# Patient Record
Sex: Female | Born: 1965 | Race: White | Hispanic: Yes | Marital: Married | Smoking: Never smoker
Health system: Southern US, Community
[De-identification: ages and names within clinical notes are randomized; demographics above are authoritative.]

## PROBLEM LIST (undated history)

## (undated) DIAGNOSIS — Z8632 Personal history of gestational diabetes: Secondary | ICD-10-CM

## (undated) DIAGNOSIS — K219 Gastro-esophageal reflux disease without esophagitis: Secondary | ICD-10-CM

## (undated) DIAGNOSIS — Z973 Presence of spectacles and contact lenses: Secondary | ICD-10-CM

## (undated) HISTORY — DX: Gastro-esophageal reflux disease without esophagitis: K21.9

---

## 2004-11-15 ENCOUNTER — Inpatient Hospital Stay (HOSPITAL_COMMUNITY): Admission: AD | Admit: 2004-11-15 | Discharge: 2004-11-15 | Payer: Self-pay | Admitting: Obstetrics and Gynecology

## 2004-11-28 ENCOUNTER — Inpatient Hospital Stay (HOSPITAL_COMMUNITY): Admission: AD | Admit: 2004-11-28 | Discharge: 2004-11-28 | Payer: Self-pay | Admitting: Obstetrics and Gynecology

## 2004-12-26 ENCOUNTER — Other Ambulatory Visit: Admission: RE | Admit: 2004-12-26 | Discharge: 2004-12-26 | Payer: Self-pay | Admitting: Gynecology

## 2005-03-04 ENCOUNTER — Encounter: Admission: RE | Admit: 2005-03-04 | Discharge: 2005-03-04 | Payer: Self-pay | Admitting: Gynecology

## 2005-05-01 ENCOUNTER — Inpatient Hospital Stay (HOSPITAL_COMMUNITY): Admission: AD | Admit: 2005-05-01 | Discharge: 2005-05-02 | Payer: Self-pay | Admitting: Obstetrics & Gynecology

## 2005-05-02 ENCOUNTER — Encounter (INDEPENDENT_AMBULATORY_CARE_PROVIDER_SITE_OTHER): Payer: Self-pay | Admitting: Specialist

## 2005-05-02 ENCOUNTER — Inpatient Hospital Stay (HOSPITAL_COMMUNITY): Admission: AD | Admit: 2005-05-02 | Discharge: 2005-05-04 | Payer: Self-pay | Admitting: Gynecology

## 2005-05-02 HISTORY — PX: OTHER SURGICAL HISTORY: SHX169

## 2005-06-13 ENCOUNTER — Other Ambulatory Visit: Admission: RE | Admit: 2005-06-13 | Discharge: 2005-06-13 | Payer: Self-pay | Admitting: Gynecology

## 2006-07-23 ENCOUNTER — Other Ambulatory Visit: Admission: RE | Admit: 2006-07-23 | Discharge: 2006-07-23 | Payer: Self-pay | Admitting: Gynecology

## 2007-01-13 ENCOUNTER — Inpatient Hospital Stay (HOSPITAL_COMMUNITY): Admission: AD | Admit: 2007-01-13 | Discharge: 2007-01-13 | Payer: Self-pay | Admitting: Obstetrics and Gynecology

## 2007-05-09 ENCOUNTER — Inpatient Hospital Stay (HOSPITAL_COMMUNITY): Admission: AD | Admit: 2007-05-09 | Discharge: 2007-05-11 | Payer: Self-pay | Admitting: Internal Medicine

## 2007-05-10 ENCOUNTER — Encounter (INDEPENDENT_AMBULATORY_CARE_PROVIDER_SITE_OTHER): Payer: Self-pay | Admitting: Obstetrics and Gynecology

## 2007-05-10 HISTORY — PX: TUBAL LIGATION: SHX77

## 2008-12-20 ENCOUNTER — Ambulatory Visit: Payer: Self-pay | Admitting: Gynecology

## 2008-12-20 ENCOUNTER — Other Ambulatory Visit: Admission: RE | Admit: 2008-12-20 | Discharge: 2008-12-20 | Payer: Self-pay | Admitting: Gynecology

## 2009-02-15 ENCOUNTER — Encounter: Admission: RE | Admit: 2009-02-15 | Discharge: 2009-02-15 | Payer: Self-pay | Admitting: Gynecology

## 2009-02-21 ENCOUNTER — Encounter: Admission: RE | Admit: 2009-02-21 | Discharge: 2009-02-21 | Payer: Self-pay | Admitting: Gynecology

## 2010-01-29 ENCOUNTER — Ambulatory Visit
Admission: RE | Admit: 2010-01-29 | Discharge: 2010-01-29 | Payer: Self-pay | Source: Home / Self Care | Attending: Gynecology | Admitting: Gynecology

## 2010-01-29 ENCOUNTER — Other Ambulatory Visit
Admission: RE | Admit: 2010-01-29 | Discharge: 2010-01-29 | Payer: Self-pay | Source: Home / Self Care | Admitting: Gynecology

## 2010-02-10 ENCOUNTER — Encounter: Payer: Self-pay | Admitting: Gynecology

## 2010-04-03 ENCOUNTER — Other Ambulatory Visit: Payer: Self-pay | Admitting: Gynecology

## 2010-04-03 DIAGNOSIS — Z1231 Encounter for screening mammogram for malignant neoplasm of breast: Secondary | ICD-10-CM

## 2010-04-24 ENCOUNTER — Ambulatory Visit
Admission: RE | Admit: 2010-04-24 | Discharge: 2010-04-24 | Disposition: A | Discharge status: Home / Self Care | Payer: PRIVATE HEALTH INSURANCE | Source: Ambulatory Visit | Attending: Gynecology | Admitting: Gynecology

## 2010-04-24 DIAGNOSIS — Z1231 Encounter for screening mammogram for malignant neoplasm of breast: Secondary | ICD-10-CM

## 2010-06-04 NOTE — Op Note (Signed)
NAMEEULIA, HATCHER                 ACCOUNT NO.:  [id number]   MEDICAL RECORD NO.:  [id number]          PATIENT TYPE:  INP   LOCATION:  9125                          FACILITY:  [facility]   PHYSICIAN:  Malachi Pro. Ambrose Mantle, M.D. DATE OF BIRTH:  1965-11-18   DATE OF PROCEDURE:  05/10/2007  DATE OF DISCHARGE:                               OPERATIVE REPORT   PREOPERATIVE DIAGNOSIS:  Voluntary sterilization   POSTOPERATIVE DIAGNOSIS:  Voluntary sterilization   OPERATION:  Bilateral tubal ligation.   OPERATOR:  Malachi Pro. Ambrose Mantle, M.D.   ANESTHESIA:  General anesthesia.   DESCRIPTION OF PROCEDURE:  The patient was brought to the operating room  and given general anesthesia.  She was already in the supine position.  The abdomen was prepped with Betadine solution and draped as a sterile  field.  The inferior portion of the umbilicus was grasped with two Allis  clamps, and an incision was made through the skin between the two Allis  clamps.  I then dissected down through the subcutaneous tissue and  actually dissected down to the peritoneal lining without making any  incision.  I then entered the peritoneal cavity, identified both tubes,  traced them to their fimbriated ends.  The superior surface of the right  ovary was seen.  I did not see the left ovary.  I made a window in the  mesosalpinx bilaterally with the Bovie.  I placed two ties of #0 plain  catgut proximally and distally on each tube, and cut out the segment of  tube in between the ties.  There was no bleeding.  All the sutures were  cut, and because of the very thin abdominal wall, I tried to make an  effort to include all layers in my closure with two figure-of-eight  sutures, and an additional interrupted suture of #0 Vicryl.  The skin  was closed with interrupted 3-0 plain catgut.   The patient seemed to tolerate the procedure well, and was returned to  the recovery room in satisfactory condition.   BLOOD LOSS:  Less than 5  mL.      Malachi Pro. Ambrose Mantle, M.D.  Electronically Signed     TFH/MEDQ  D:  05/10/2007  T:  05/10/2007  Job:  [id number]

## 2010-06-04 NOTE — H&P (Signed)
NAMELILLYEN, SCHOW                 ACCOUNT NO.:  [id number]   MEDICAL RECORD NO.:  [id number]          PATIENT TYPE:  INP   LOCATION:  9125                          FACILITY:  [facility]   PHYSICIAN:  Malachi Pro. Ambrose Mantle, M.D. DATE OF BIRTH:  [birth year]   DATE OF ADMISSION:  05/09/2007  DATE OF DISCHARGE:                              HISTORY & PHYSICAL   This is a 45 year old Hispanic single female, para 3-1-1-4, gravida 65,  EDC May 19, 2007, who was admitted straight to labor and delivery  without stopping in the [place].  I was called and came immediately to the  hospital but the patient had already delivered when I arrived.  She was  delivered by a family practice resident.  The delivery was at 251, the  female was 5 pounds 14 ounces without Apgars of 9 at 1 and 9 at 5 minutes.  After I arrived, I did remove placental fragments mainly membranes from  the uterus.  There were no significant lacerations.  Blood group and  type was B+ with a negative antibody, nonreactive serology, rubella  immune, hepatitis B surface antigen negative, HIV negative, GC and  chlamydia negative.  One-hour Glucola 100.  Group B strep negative.  First trimester screen is negative, AFP negative.  The patient received  progesterone weekly for history of preterm birth.  She had E-coli in her  urine that was treated.  Prenatal care was otherwise uncomplicated.   PAST MEDICAL HISTORY:  No known allergies.  No operations.  No  illnesses.   SOCIAL HISTORY:  Alcohol, tobacco and drugs none.   FAMILY HISTORY:  Negative.   OBSTETRIC HISTORY:  Revealed four previous vaginal deliveries, one was  premature 35-36 weeks weighing 4 pounds 5 ounces.  The patient also had  a spontaneous abortion.   PHYSICAL EXAM ON ADMISSION:  VITAL SIGNS:  Normal.  HEART/LUNGS:  Normal.  ABDOMEN:  Soft.  When I saw the patient, she had already delivered the  fundus, was below the umbilicus, membranes were removed from the uterus  on multiple  entries with the ring forceps and my fingers no lacerations.  The patient does request bilateral tubal ligation.  This has been  discussed with her through an interpreter with her and her significant  other and they request to proceed with tubal ligation.   IMPRESSION:  Intrauterine pregnancy at 38+ weeks delivered, voluntary  sterilization.  The patient is prepared for tubal ligation.      Malachi Pro. Ambrose Mantle, M.D.  Electronically Signed     TFH/MEDQ  D:  05/10/2007  T:  05/10/2007  Job:  [id number]

## 2010-06-04 NOTE — Discharge Summary (Signed)
Suzanne Harrington, Harrington                 ACCOUNT NO.:  [id number]   MEDICAL RECORD NO.:  [id number]          PATIENT TYPE:  INP   LOCATION:  9125                          [facility]ACILITY:  [facility]   PHYSICIAN:  Malachi Pro. Ambrose Mantle, M.D. DATE OF BIRTH:  1965-10-14   DATE OF ADMISSION:  05/09/2007  DATE OF DISCHARGE:  05/11/2007                               DISCHARGE SUMMARY   This is a 45 year old Hispanic single female para 3-1-1-4, gravida 81,  EDC May 19, 2007, admitted without stopping in the maternity admission  unit because she was in active labor.  I was called and came immediately  to the hospital but the baby had delivered by Dr. Ludwig Clarks, a family  practice resident at 2:51 p.m.  The baby was female and had Apgars of 9 at  1 and 9 at 5 minutes.  The baby's weight was 5 pounds 14 ounces.  After  I arrived, I checked the inside of the uterus.  There were copious  membranes left in the uterus which I removed with my fingers and with  the ring forceps.  There were no significant lacerations.  The patient's  blood group and type was B+ with a negative antibody, nonreactive  serology, rubella immune, hepatitis B surface antigen negative, HIV  negative, GC and Chlamydia negative.  One-hour Glucola 100.  Group B  strep negative.  First trimester screen negative, AFP was negative.  The  patient had received progesterone weekly for history of preterm birth.  She had an E-coli UTI that was treated in early pregnancy.  Prenatal  care was otherwise uncomplicated.   PAST MEDICAL HISTORY:  No known allergies.  No operations.  No  illnesses.   SOCIAL HISTORY:  Alcohol, tobacco and drugs none.   FAMILY HISTORY:  Negative.   OBSTETRIC HISTORY:  Reported in the history and physical.   POSTPARTUM PHYSICAL EXAM:  VITAL SIGNS:  Normal.  HEART/LUNGS:  Normal.  ABDOMEN:  Soft, fundus was below the umbilicus.   The patient requested tubal ligation through an interpreter.  We  confirmed that she was very serious  about the tubal ligation  and  understood the risks and benefits.  She underwent a tubal ligation by  Dr. Ambrose Mantle under general anesthesia on May 10, 2007.  Blood loss was  less than 5 mL but after the patient was returned to recovery room,  there was some bleeding from the left side of the incision that did not  stop with reinforcing the dressings.  So, Dr. Jackelyn Knife came in and  instilled the incision with 0.5% Marcaine and use three sutures of 3-0  Vicryl to control the bleeding.  The patient has had no further problems  on the second postpartum and first postop day, is ready for discharge.   LABORATORY DATA:  Initial hemoglobin  12.6, hematocrit 36.5, white count  7,900, platelet count 130,000.  Follow-up hemoglobin 11.3, platelet  count 118,000.  RPR was nonreactive.   FINAL DIAGNOSIS:  Intrauterine pregnancy at 38+ weeks delivered,  voluntary sterilization.   OPERATION:  Bilateral tubal ligation, suture of umbilical and skin  incision to control bleeding.   FINAL CONDITION:  Improved.   INSTRUCTIONS:  Regular discharge instructions.  The patient is given  prescriptions for Motrin 600 mg #30 tablets one every 6 hours as needed  for pain and Percocet 5/325 #30 tablets one every 4-6 hours as needed  for pain.  She is advised to return to the office in 2 weeks for follow-  up examination.      Malachi Pro. Ambrose Mantle, M.D.  Electronically Signed     TFH/MEDQ  D:  05/11/2007  T:  05/11/2007  Job:  [id number]

## 2010-06-07 NOTE — Op Note (Signed)
[name], Suzanne Harrington                 ACCOUNT NO.:  [id number]   MEDICAL RECORD NO.:  [id number]          PATIENT TYPE:  INP   LOCATION:  9198                          FACILITY:  [facility]   PHYSICIAN:  Juan H. Lily Peer, M.D.DATE OF BIRTH:  08/19/1965   DATE OF PROCEDURE:  05/02/2005  DATE OF DISCHARGE:                                 OPERATIVE REPORT   PROCEDURE:  Manual extraction of placenta.   SURGEON:  Juan H. Lily Peer, M.D.   DESCRIPTION OF PROCEDURE:  Manual extraction of the placenta was undertaken  in a sterile fashion after the patient had received fentanyl, Versed and  propofol and nitroglycerin sublingual to help relax her uterus.  An IV line  with LR had been started and the patient had been typed and screened and a  CBC had been obtained.  After the vagina and perineum were prepped and  draped in the usual sterile fashion, a manual extraction of the placenta was  accomplished with complete evacuation of the entire uterus.  Pitocin 40  units in a liter of LR was administered intravenously; the uterus firmed and  total blood loss from the delivery and the postpartum period in the  operating room for approximately 300-400 mL.  The patient remained  normotensive during the entire time.   She will receive a gram of Cefotan IV for prophylaxis and will be in the  recovery room and then to the routine postpartum orders.      Juan H. Lily Peer, M.D.  Electronically Signed     JHF/MEDQ  D:  05/02/2005  T:  05/02/2005  Job:  [id number]

## 2010-06-07 NOTE — H&P (Signed)
NAMEJORDON, BOURQUIN                 ACCOUNT NO.:  [id number]   MEDICAL RECORD NO.:  [id number]          PATIENT TYPE:  INP   LOCATION:  9198                          [facility]ILITY[facility]  [facility]   PHYSICIAN:  Juan H. Lily Peer, M.D.DATE OF BIRTH:  04-28-1965   DATE OF ADMISSION:  05/02/2005  DATE OF DISCHARGE:                                HISTORY & PHYSICAL   HISTORY:  The patient is a 45 year old gravida 4, para 3 at 34-5/7 weeks'  gestation, that presented to [facility] last evening thinking that she  had rupture of membranes.  Nitrazine and fern were negative.  She was not in  labor.  Fetal heart rate tracing was reassuring and had an ultrasound with  an AFI of 10 cm.  The patient was discharged home with instructions to  follow up in the office in the morning and this morning the patient and her  husband stated that she began having contractions at 7:30 in the morning and  was rushed to [facility] and arrived at 9:11 a.m. this morning.   DELIVERY NOTE:  She was completely dilated and delivered precipitously by  the nursing team in Maternity Admissions upon my arrival.  She delivered a  viable female infant, Apgar scores of 8 and 9, with a weight of 4 pounds 9  ounces and went to the newborn nursery.  The umbilical cord had sheared off  in the placenta and the patient was rushed to the operating room for manual  extraction of the placenta.      Juan H. Lily Peer, M.D.  Electronically Signed     JHF/MEDQ  D:  05/02/2005  T:  05/02/2005  Job:  [id number]

## 2010-06-21 ENCOUNTER — Ambulatory Visit (INDEPENDENT_AMBULATORY_CARE_PROVIDER_SITE_OTHER): Payer: PRIVATE HEALTH INSURANCE | Admitting: Women's Health

## 2010-06-21 DIAGNOSIS — N926 Irregular menstruation, unspecified: Secondary | ICD-10-CM

## 2010-06-21 DIAGNOSIS — R51 Headache: Secondary | ICD-10-CM

## 2010-10-15 LAB — CBC
HCT: 33.2 — ABNORMAL LOW
HCT: 36.5
Hemoglobin: 11.3 — ABNORMAL LOW
Hemoglobin: 12.6
MCHC: 34.1
MCHC: 34.6
MCV: 84.2
MCV: 85.5
Platelets: 118 — ABNORMAL LOW
Platelets: 130 — ABNORMAL LOW
RBC: 3.88
RBC: 4.34
RDW: 15.8 — ABNORMAL HIGH
RDW: 16.1 — ABNORMAL HIGH
WBC: 12.8 — ABNORMAL HIGH
WBC: 7.9

## 2010-10-15 LAB — RPR: RPR Ser Ql: NONREACTIVE

## 2011-03-13 ENCOUNTER — Encounter: Payer: Self-pay | Admitting: Gynecology

## 2011-03-13 ENCOUNTER — Ambulatory Visit (INDEPENDENT_AMBULATORY_CARE_PROVIDER_SITE_OTHER): Payer: PRIVATE HEALTH INSURANCE | Admitting: Gynecology

## 2011-03-13 VITALS — BP 120/72 | Ht 62.5 in | Wt 136.0 lb

## 2011-03-13 DIAGNOSIS — N898 Other specified noninflammatory disorders of vagina: Secondary | ICD-10-CM

## 2011-03-13 DIAGNOSIS — Z01419 Encounter for gynecological examination (general) (routine) without abnormal findings: Secondary | ICD-10-CM

## 2011-03-13 DIAGNOSIS — L293 Anogenital pruritus, unspecified: Secondary | ICD-10-CM

## 2011-03-13 LAB — CBC WITH DIFFERENTIAL/PLATELET
Basophils Absolute: 0 10*3/uL (ref 0.0–0.1)
Basophils Relative: 1 % (ref 0–1)
Eosinophils Absolute: 0 10*3/uL (ref 0.0–0.7)
Eosinophils Relative: 1 % (ref 0–5)
HCT: 42.1 % (ref 36.0–46.0)
Hemoglobin: 13.9 g/dL (ref 12.0–15.0)
Lymphocytes Relative: 38 % (ref 12–46)
Lymphs Abs: 1.7 10*3/uL (ref 0.7–4.0)
MCH: 28.7 pg (ref 26.0–34.0)
MCHC: 33 g/dL (ref 30.0–36.0)
MCV: 87 fL (ref 78.0–100.0)
Monocytes Absolute: 0.5 10*3/uL (ref 0.1–1.0)
Monocytes Relative: 10 % (ref 3–12)
Neutro Abs: 2.3 10*3/uL (ref 1.7–7.7)
Platelets: 275 10*3/uL (ref 150–400)
RBC: 4.84 MIL/uL (ref 3.87–5.11)
RDW: 13.2 % (ref 11.5–15.5)
WBC: 4.5 10*3/uL (ref 4.0–10.5)

## 2011-03-13 LAB — WET PREP FOR TRICH, YEAST, CLUE: Trich, Wet Prep: NONE SEEN

## 2011-03-13 LAB — CHOLESTEROL, TOTAL: Cholesterol: 164 mg/dL (ref 0–200)

## 2011-03-13 MED ORDER — TERCONAZOLE 0.8 % VA CREA: 1.0000 | TOPICAL_CREAM | Freq: Every day | VAGINAL | Status: AC

## 2011-03-13 NOTE — Progress Notes (Signed)
Suzanne Harrington 11-29-1965 [id number]   History:    46 y.o.  for annual exam with a complaint of vulvar pruritus. Patient stated a month ago she went to an urgent care facility and was treated for a "bacterial infection" with similar complaints. Review of patient's records indicated that she had a normal Pap smear November 2012. She's had a previous tubal sterilization procedure. She's has normal menstrual cycles. Last mammogram April 2012 negative. Patient in frequently does her self breast examinations. Patient was weighing 140 pounds last years down to 136. (BMI less than 25).  Past medical history,surgical history, family history and social history were all reviewed and documented in the EPIC chart.  Gynecologic History Patient's last menstrual period was 02/19/2011. Contraception: tubal ligation Last Pap: 2012. Results were: normal Last mammogram: 2012. Results were: normal  Obstetric History OB History    Grav Para Term Preterm Abortions TAB SAB Ect Mult Living   6 5 5  1  1   5      # Outc Date GA Lbr Len/2nd Wgt Sex Del Anes PTL Lv   1 TRM     F SVD  No Yes   2 TRM     F SVD  No Yes   3 TRM     F SVD  No Yes   4 TRM     F SVD  No Yes   5 TRM     M SVD  No Yes   6 SAB                ROS:  Was performed and pertinent positives and negatives are included in the history.  Exam: chaperone present  BP 120/72  Ht 5' 2.5" (1.588 m)  Wt 136 lb (61.689 kg)  BMI 24.48 kg/m2  LMP 02/19/2011  Body mass index is 24.48 kg/(m^2).  General appearance : Well developed well nourished female. No acute distress HEENT: Neck supple, trachea midline, no carotid bruits, no thyroidmegaly Lungs: Clear to auscultation, no rhonchi or wheezes, or rib retractions  Heart: Regular rate and rhythm, no murmurs or gallops Breast:Examined in sitting and supine position were symmetrical in appearance, no palpable masses or tenderness,  no skin retraction, no nipple inversion, no nipple discharge, no skin  discoloration, no axillary or supraclavicular lymphadenopathy Abdomen: no palpable masses or tenderness, no rebound or guarding Extremities: no edema or skin discoloration or tenderness  Pelvic:  Bartholin, Urethra, Skene Glands: Within normal limits             Vagina: No gross lesions or discharge  Cervix: No gross lesions or discharge  Uterus  anteverted, normal size, shape and consistency, non-tender and mobile  Adnexa  Without masses or tenderness  Anus and perineum  normal   Rectovaginal  normal sphincter tone without palpated masses or tenderness             Hemoccult not done  Wet prep: Few bacteria noted    Assessment/Plan:  46 y.o. female for annual exam with vulvar pruritus. Wet prep demonstrated few bacteria. Were going to give her a trial of Terazol 7 to apply each bedtime for 7 days externally and of her symptoms persist she'll return back to the office for colposcopic evaluation. New Pap smear screening guidelines discussed so no Pap smear was done today since the last was done 2012 and was normal. CBC, cholesterol, and urinalysis was obtained today. She was reminded she needs her mammogram in April and to continue to do her  monthly self breast examination.    Ok Edwards MD, 10:59 AM 03/13/2011

## 2011-03-13 NOTE — Patient Instructions (Signed)
Recuerdate de hacer cita en Abril para el mammograma

## 2011-03-14 LAB — URINALYSIS W MICROSCOPIC + REFLEX CULTURE
Bacteria, UA: NONE SEEN
Bilirubin Urine: NEGATIVE
Casts: NONE SEEN
Crystals: NONE SEEN
Ketones, ur: NEGATIVE mg/dL
Leukocytes, UA: NEGATIVE
Nitrite: NEGATIVE
Protein, ur: NEGATIVE mg/dL
Specific Gravity, Urine: 1.023 (ref 1.005–1.030)
Squamous Epithelial / LPF: NONE SEEN
Urobilinogen, UA: 0.2 mg/dL (ref 0.0–1.0)
pH: 6 (ref 5.0–8.0)

## 2011-04-13 ENCOUNTER — Emergency Department (HOSPITAL_COMMUNITY)
Admission: EM | Admit: 2011-04-13 | Discharge: 2011-04-13 | Disposition: A | Discharge status: Home / Self Care | Payer: PRIVATE HEALTH INSURANCE | Attending: Emergency Medicine | Admitting: Emergency Medicine

## 2011-04-13 ENCOUNTER — Encounter (HOSPITAL_COMMUNITY): Payer: Self-pay | Admitting: *Deleted

## 2011-04-13 DIAGNOSIS — B9789 Other viral agents as the cause of diseases classified elsewhere: Secondary | ICD-10-CM | POA: Insufficient documentation

## 2011-04-13 DIAGNOSIS — R509 Fever, unspecified: Secondary | ICD-10-CM | POA: Insufficient documentation

## 2011-04-13 DIAGNOSIS — R109 Unspecified abdominal pain: Secondary | ICD-10-CM | POA: Insufficient documentation

## 2011-04-13 DIAGNOSIS — K529 Noninfective gastroenteritis and colitis, unspecified: Secondary | ICD-10-CM

## 2011-04-13 DIAGNOSIS — R197 Diarrhea, unspecified: Secondary | ICD-10-CM | POA: Insufficient documentation

## 2011-04-13 DIAGNOSIS — K5289 Other specified noninfective gastroenteritis and colitis: Secondary | ICD-10-CM | POA: Insufficient documentation

## 2011-04-13 DIAGNOSIS — B349 Viral infection, unspecified: Secondary | ICD-10-CM

## 2011-04-13 DIAGNOSIS — R112 Nausea with vomiting, unspecified: Secondary | ICD-10-CM | POA: Insufficient documentation

## 2011-04-13 LAB — URINALYSIS, ROUTINE W REFLEX MICROSCOPIC
Glucose, UA: NEGATIVE mg/dL
Ketones, ur: 15 mg/dL — AB
Leukocytes, UA: NEGATIVE
Nitrite: NEGATIVE
Protein, ur: 30 mg/dL — AB
Specific Gravity, Urine: 1.027 (ref 1.005–1.030)
Urobilinogen, UA: 1 mg/dL (ref 0.0–1.0)

## 2011-04-13 LAB — URINE MICROSCOPIC-ADD ON

## 2011-04-13 LAB — HEPATIC FUNCTION PANEL
ALT: 38 U/L — ABNORMAL HIGH (ref 0–35)
AST: 42 U/L — ABNORMAL HIGH (ref 0–37)
Albumin: 4.2 g/dL (ref 3.5–5.2)
Bilirubin, Direct: <0.1 mg/dL (ref 0.0–0.3)
Total Bilirubin: 0.5 mg/dL (ref 0.3–1.2)
Total Protein: 8.2 g/dL (ref 6.0–8.3)

## 2011-04-13 LAB — LIPASE, BLOOD: Lipase: 43 U/L (ref 11–59)

## 2011-04-13 LAB — PREGNANCY, URINE: Preg Test, Ur: NEGATIVE

## 2011-04-13 MED ORDER — SODIUM CHLORIDE 0.9 % IV BOLUS (SEPSIS)
1000.0000 mL | Freq: Once | INTRAVENOUS | Status: AC
  Administered 2011-04-13: 1000 mL via INTRAVENOUS

## 2011-04-13 MED ORDER — KETOROLAC TROMETHAMINE 30 MG/ML IJ SOLN
30.0000 mg | Freq: Once | INTRAMUSCULAR | Status: AC
  Administered 2011-04-13: 30 mg via INTRAVENOUS
  Filled 2011-04-13: qty 1

## 2011-04-13 MED ORDER — ONDANSETRON 8 MG PO TBDP: 8.0000 mg | ORAL_TABLET | Freq: Three times a day (TID) | ORAL | Status: AC | PRN

## 2011-04-13 MED ORDER — ONDANSETRON HCL 4 MG/2ML IJ SOLN
4.0000 mg | Freq: Once | INTRAMUSCULAR | Status: AC
  Administered 2011-04-13: 4 mg via INTRAVENOUS
  Filled 2011-04-13: qty 2

## 2011-04-13 NOTE — ED Notes (Signed)
Urine specimen collected sent to lab. Lab is holding until further notice.TF

## 2011-04-13 NOTE — ED Provider Notes (Signed)
History     CSN: [id number]  Arrival date & time 04/13/11  1039   First MD Initiated Contact with Patient 04/13/11 1111      Chief Complaint  Patient presents with  . Diarrhea  . Emesis    (Consider location/radiation/quality/duration/timing/severity/associated sxs/prior treatment) HPI Patient is a 46 year old predominantly Spanish speaking female who presents today complaining of nausea, vomiting, diarrhea, and fever since Friday. Her son at home has the exact same symptoms. Patient reports that she does not have significant abdominal pain unless vomiting. She denies any blood in her emesis or stools. Patient denies any urinary symptoms. Patient has no history of abdominal surgery. She has vomited several times today and also had diarrhea twice. The patient reports that she's been unable to keep any fluids down this morning. The exam was conducted by myself in Spanish and patient was comfortable with my interpretation.There are no other associated or modifying factors.  History reviewed. No pertinent past medical history.  Past Surgical History  Procedure Date  . Tubal ligation     History reviewed. No pertinent family history.  History  Substance Use Topics  . Smoking status: Never Smoker   . Smokeless tobacco: Never Used  . Alcohol Use: No    OB History    Grav Para Term Preterm Abortions TAB SAB Ect Mult Living   6 5 5  1  1   5       Review of Systems  Constitutional: Positive for fever.  HENT: Negative.   Eyes: Negative.   Respiratory: Negative.   Cardiovascular: Negative.   Gastrointestinal: Positive for nausea, vomiting, abdominal pain and diarrhea.  Genitourinary: Negative.   Musculoskeletal: Negative.   Skin: Negative.   Neurological: Negative.   Hematological: Negative.   Psychiatric/Behavioral: Negative.   All other systems reviewed and are negative.    Allergies  Review of patient's allergies indicates no known allergies.  Home Medications    Current Outpatient Rx  Name Route Sig Dispense Refill  . ACETAMINOPHEN 325 MG PO TABS Oral Take 650 mg by mouth every 6 (six) hours as needed. For pain.      BP 120/82  Pulse 83  Temp(Src) 98 F (36.7 C) (Oral)  Resp 18  SpO2 100%  LMP 03/20/2011  Physical Exam  Nursing note and vitals reviewed. GEN: Well-developed, well-nourished female in no distress, uncomfortable HEENT: Atraumatic, normocephalic. Oropharynx clear without erythema EYES: PERRLA BL, no scleral icterus. NECK: Trachea midline, no meningismus CV: regular rate and rhythm. No murmurs, rubs, or gallops PULM: No respiratory distress.  No crackles, wheezes, or rales. GI: soft, mild diffuse tenderness to palpation. No guarding, rebound. Hyperactive bowel sounds. GU: deferred Neuro: cranial nerves 2-12 intact, no abnormalities of strength or sensation, A and O x 3 MSK: Patient moves all 4 extremities symmetrically, no deformity, edema, or injury noted Skin: No rashes petechiae, purpura, or jaundice Psych: no abnormality of mood   ED Course  Procedures (including critical care time)  Labs Reviewed  HEPATIC FUNCTION PANEL - Abnormal; Notable for the following:    AST 42 (*)    ALT 38 (*)    All other components within normal limits  URINALYSIS, ROUTINE W REFLEX MICROSCOPIC - Abnormal; Notable for the following:    Color, Urine AMBER (*) BIOCHEMICALS MAY BE AFFECTED BY COLOR   APPearance CLOUDY (*)    Hgb urine dipstick LARGE (*)    Bilirubin Urine SMALL (*)    Ketones, ur 15 (*)    Protein, ur  30 (*)    All other components within normal limits  URINE MICROSCOPIC-ADD ON - Abnormal; Notable for the following:    Squamous Epithelial / LPF FEW (*)    Bacteria, UA FEW (*)    All other components within normal limits  LIPASE, BLOOD  PREGNANCY, URINE   No results found.   1. Gastroenteritis   2. Viral syndrome       MDM  Patient was evaluated by myself. Based on presentation patient most likely had a  viral process contributing to her symptoms. She was given IV fluids, Toradol, and Zofran. Hepatic function panel and lipase as well as urinalysis and urine pregnancy were sent for confirmation there were no other causes of pathology today. Patient was hemodynamically stable. She'll be reassessed following therapy.  1:47 PM Patient feeling better after her therapy. No significant findings on laboratory results. Minimal bump in AST and ALT. Patient is pain-free and has tolerated by mouth intake. Patient will be discharged with prescription for Zofran. She can followup with her regular Dr. as needed.        Cyndra Numbers, MD 04/13/11 1348

## 2011-04-13 NOTE — ED Notes (Signed)
Reports n/v/d since Friday with mild fever. Mask on pt at triage.

## 2011-06-04 ENCOUNTER — Other Ambulatory Visit: Payer: Self-pay | Admitting: Gynecology

## 2011-06-04 DIAGNOSIS — Z1231 Encounter for screening mammogram for malignant neoplasm of breast: Secondary | ICD-10-CM

## 2011-07-09 ENCOUNTER — Ambulatory Visit
Admission: RE | Admit: 2011-07-09 | Discharge: 2011-07-09 | Disposition: A | Discharge status: Home / Self Care | Payer: PRIVATE HEALTH INSURANCE | Source: Ambulatory Visit | Attending: Gynecology | Admitting: Gynecology

## 2011-07-09 DIAGNOSIS — Z1231 Encounter for screening mammogram for malignant neoplasm of breast: Secondary | ICD-10-CM

## 2012-07-05 ENCOUNTER — Other Ambulatory Visit: Payer: Self-pay

## 2012-07-05 DIAGNOSIS — Z1231 Encounter for screening mammogram for malignant neoplasm of breast: Secondary | ICD-10-CM

## 2012-07-08 ENCOUNTER — Encounter: Payer: Self-pay | Admitting: Gynecology

## 2012-07-08 ENCOUNTER — Ambulatory Visit (INDEPENDENT_AMBULATORY_CARE_PROVIDER_SITE_OTHER): Payer: PRIVATE HEALTH INSURANCE | Admitting: Gynecology

## 2012-07-08 VITALS — BP 120/82 | Ht 62.25 in | Wt 136.0 lb

## 2012-07-08 DIAGNOSIS — Z23 Encounter for immunization: Secondary | ICD-10-CM

## 2012-07-08 DIAGNOSIS — Z01419 Encounter for gynecological examination (general) (routine) without abnormal findings: Secondary | ICD-10-CM

## 2012-07-08 LAB — CBC WITH DIFFERENTIAL/PLATELET
Basophils Absolute: 0 10*3/uL (ref 0.0–0.1)
Eosinophils Absolute: 0.1 10*3/uL (ref 0.0–0.7)
Eosinophils Relative: 1 % (ref 0–5)
HCT: 39.4 % (ref 36.0–46.0)
Lymphs Abs: 3 10*3/uL (ref 0.7–4.0)
MCH: 28.1 pg (ref 26.0–34.0)
MCV: 83.3 fL (ref 78.0–100.0)
Monocytes Absolute: 0.5 10*3/uL (ref 0.1–1.0)
Platelets: 265 10*3/uL (ref 150–400)
RDW: 13.7 % (ref 11.5–15.5)

## 2012-07-08 NOTE — Patient Instructions (Signed)
Vacuna difteria, tétanos, tos ferina (DTP) - Lo que debe saber   (Tetanus, Diphtheria, Pertussis [Tdap] Vaccine, What You Need to Know)  ¿PORQUÉ VACUNARSE?   El tétanos, la difteria y la tos ferina pueden ser enfermedades muy graves, aún en adolescentes y adultos. La vacuna Tdap nos puede proteger de estas enfermedades.   El TÉTANOS (Trismo) provoca la contracción dolorosa de los músculos, por lo general, en todo el cuerpo.   · Puede causar el endurecimiento de los músculos de la cabeza y el cuello, de modo que impide abrir la boca, tragar y en algunos casos, respirar. El tétanos causa la muerte de 1 de cada 5 personas que se infectan.  La DIFTERIA produce la formación de una membrana gruesa que cubre el fondo de la garganta.   · Puede causar problemas respiratorios, parálisis, insuficiencia cardíaca e incluso la muerte.  TOS FERINA (Pertusis) causa episodios de tos graves, que pueden hacer difícil la respiración, causar vómitos y trastornos del sueño.   · También puede ser la causa de pérdida de peso, incontinencia y fractura de costillas. Dos de cada 100 adolescentes y cinco de cada 100 adultos que enferman de pertusis deben ser hospitalizados, tienen complicaciones como la neumonía o mueren.  Estas enfermedades son provocadas por bacterias. La difteria y el pertusis se contagian de persona a persona a través de la tos o el estornudo. El tétanos ingresa al organismo a través de cortes, rasguños o heridas.   Antes de las vacunas, en los [place] se vieron más de 200.000 casos al año de difteria y tos ferina y cientos de casos de tétanos. Desde el inicio de la vacunación, los casos de tétanos y difteria han disminuido alrededor del 99% y los casos de tos ferina alrededor del 80%.   Tdap   La vacuna Tdap protege a adolescentes y adultos contra el tétanos, la difteria y la tos ferina. Una dosis de Tdap se administra a los 11 o 12 años de edad. Las personas que no recibieron la vacuna Tdap a esa edad deben  recibirla tan pronto como sea posible.   Es muy importante que los profesionales de la salud y todos aquellos que tengan contacto cercano con bebés menores de 12 meses reciban la Tdap.   Las mujeres embarazadas deben recibir una dosis de Tdap en cada embarazo, para proteger al recién nacido de la tos ferina. Los niños tienen mayor riesgo de complicaciones graves y potencialmente mortales debido a la tos ferina.   Una vacuna similar, llamada Td, protege contra el tétanos y la difteria, pero no contra la tos ferina. Cada 10 años debe recibirse un refuerzo de Td. La Tdap se puede administrar como uno de estos refuerzos, si todavía no ha recibido una dosis. También se puede aplicar después de un corte o quemadura grave para prevenir la infección por tétanos.   El médico le dará más información.   La Tdap puede administrarse de manera segura simultáneamente con otras vacunas.   ALGUNAS PERSONAS NO DEBEN RECIBIR ESTA VACUNA.   · Si alguna vez tuvo una reacción alérgica potencialmente mortal después de una dosis de la vacuna contra el tétanos, la diferia o la tos ferina, o tuvo una alergia grave a cualquiera de los componentes de esta vacuna, no debe aplicarse la vacuna. Informe a su médico si usted sufre algún tipo de alergia grave.  · Si estuvo en coma o sufrió múltiples convulsiones dentro de los 7 días posteriores después de una dosis de DTP o DTaP   no debe recibir la Tdap, salvo que se encuentre otra causa En este caso puede recibir la Td.  · Consulte con su médico si:  · tiene epilepsia u otra enfermedad del sistema nervioso,  · siente dolor intenso o se hincha después de recibir cualquier vacuna contra la difteria, el tétanos o la tos ferina,  · alguna vez ha sufrido el síndrome de Guillain-Barré,  · no se siente bien el día en que se ha programado la vacuna.  RIESGOS DE UNA REACCIÓN A LA VACUNA  Con cualquier medicamento, incluyendo las vacunas, existe la posibilidad de que aparezcan efectos secundarios. Estos son  leves y desaparecen por sí solos, pero también son posibles las reacciones graves.   Breves episodios de desmayo pueden seguir a una vacunación, causando lesiones por la caída. Sentarse o recostarse durante 15 minutos puede ayudar a evitarlo. Informe al médico si se siente mareado o aturdido, tiene cambios en la visión o zumbidos en los oídos.   Problemas leves luego de la Tdap (no interferirán con las actividades)   · Dolor en el sitio de la inyección (alrededor de 1 de cada 4 adolescentes o 2 de cada 3 adultos).  · Enrojecimiento o hinchazón en el lugar de la inyección (1 de cada 5 personas).  · Fiebre leve de al menos 100,4° F (38° C) (hasta alrededor de 1 cada 25 adolescentes y 1 de cada 100 adultos).  · Dolor de cabeza (3 o 4de cada 10 personas).  · Cansancio (1 de cada 3 o 4 personas).  · Náuseas, vómitos, diarrea, dolor de estómago (1 de cada 4 adolescentes o 1 de cada 10 adultos).  · Escalofríos, dolores corporales, dolor articular, erupciones, inflamación de las glándulas (poco frecuente).  Problemas moderados: (interfieren con las actividades, pero no requieren atención médica)   · Dolor en el lugar de la inyección (1 de cada 5 adolescentes o 1 de cada 100 adultos).  · Enrojecimiento o inflamación (1 de cada 16 adolescentes y 1 de cada 25 adultos).  · Fiebre de más de 102°F o 38,9°C (1 de cada 100 adolescentes o 1 de cada 250 adultos).  · Dolor de cabeza (alrededor de 4 de cada 20 adolescentes y 3 de cada 10 adultos).  · Náuseas, vómitos, diarrea, dolor de estómago (1 a 3 de cada 100 personas).  · Hinchazón de todo el brazo en el que se aplicó la vacuna (3 de cada100 personas).  Problemas graves: luego de la Tdap (no puede realizar las actividades habituales, requiere atención médica)   · Inflamación, dolor intenso, sangrado y enrojecimiento en el brazo, en el sitio de la inyección (poco frecuente).  Una reacción alérgica grave puede ocurrir después de la administración de cualquier vacuna (se estima en  menos de 1 en un millón de dosis).   ¿QUÉ PASA SI HAY UNA REACCIÓN GRAVE?   ¿Qué signos debo buscar?  · Observe todo lo que le preocupe, como signos de una reacción alérgica grave, fiebre muy alta o cambios en el comportamiento.  Los signos de una reacción alérgica grave pueden incluir urticaria, hinchazón de la cara y la garganta, dificultad para respirar, ritmo cardíaco acelerado, mareos y debilidad. Estos síntomas pueden comenzar entre unos pocos minutos y algunas horas después de la vacunación.   ¿Qué debo hacer?  · Si usted piensa que se trata de una reacción alérgica grave o de otra emergencia que no puede esperar, llame al 911 o lleve a la persona al hospital más cercano. De lo contrario, llame   a su médico.  · Después, la reacción debe informarse a la "Vaccine Adverse Event Reporting System" (Sistema de información sobre efectos adversos de las vacunas -VAERS). El médico o usted mismo pueden realizar el informe en el sitio web del VAERS www.vaers.hhs.govo llame al 1-800-822-7967.  El VAERS es sólo para informar reacciones. No brindan consejo médico.   PROGRAMA NACIONAL DE COMPENSACIÓN DE DAÑOS POR VACUNAS   El National Vaccine Injury Compensation Program (VICP) es un programa federal que fue creado para compensar a las personas que puedan haber sufrido daños al recibir ciertas vacunas.   Aquellas personas que consideren que han sufrido un daño como consecuencia de una vacuna y quieren saber más acerca del programa y como presentar una denuncia, pueden llamar 1-800-338-2382 o visite el sitio web del VICP en www.hrsa.gov/vaccinecompensation.   ¿CÓMO PUEDO OBTENER MÁS INFORMACIÓN?   · Consulte a su médico.  · Comuníquese con el servicio de salud de su localidad o su estado.  · Comuníquese con los Centros para el control y la prevención de enfermedades (Centers for Disease Control and Prevention , CDC).  · llamando al 1-800-232-4636 o visitando el sitio web del CDC en www.cdc.gov/vaccines.  CDC Tdap Vaccine VIS  (05/29/11)   Document Released: 12/24/2011  ExitCare® Patient Information ©2014 ExitCare, LLC.

## 2012-07-08 NOTE — Progress Notes (Signed)
Suzanne Harrington [birth year] [id number]   History:    47 y.o.  for annual gyn exam with no complaints today. Patient with prior history of tubal sterilization procedure. Patient would know prior history of abnormal Pap smears. Her last mammogram was in June 2013 she has one scheduled in the next 2 weeks. Patient reports normal menstrual cycle. The patient has not received the Tdap vaccine.  Past medical history,surgical history, family history and social history were all reviewed and documented in the EPIC chart.  Gynecologic History Patient's last menstrual period was 06/08/2012. Contraception: tubal ligation Last Pap: 2012. Results were: normal Last mammogram: 2013. Results were: normal  Obstetric History OB History   Grav Para Term Preterm Abortions TAB SAB Ect Mult Living   6 5 5  1  1   5      # Outc Date GA Lbr Len/2nd Wgt Sex Del Anes PTL Lv   1 TRM     F SVD  No Yes   2 TRM     F SVD  No Yes   3 TRM     F SVD  No Yes   4 TRM     F SVD  No Yes   5 TRM     M SVD  No Yes   6 SAB                ROS: A ROS was performed and pertinent positives and negatives are included in the history.  GENERAL: No fevers or chills. HEENT: No change in vision, no earache, sore throat or sinus congestion. NECK: No pain or stiffness. CARDIOVASCULAR: No chest pain or pressure. No palpitations. PULMONARY: No shortness of breath, cough or wheeze. GASTROINTESTINAL: No abdominal pain, nausea, vomiting or diarrhea, melena or bright red blood per rectum. GENITOURINARY: No urinary frequency, urgency, hesitancy or dysuria. MUSCULOSKELETAL: No joint or muscle pain, no back pain, no recent trauma. DERMATOLOGIC: No rash, no itching, no lesions. ENDOCRINE: No polyuria, polydipsia, no heat or cold intolerance. No recent change in weight. HEMATOLOGICAL: No anemia or easy bruising or bleeding. NEUROLOGIC: No headache, seizures, numbness, tingling or weakness. PSYCHIATRIC: No depression, no loss of interest in normal activity  or change in sleep pattern.     Exam: chaperone present  BP 120/82  Ht 5' 2.25" (1.581 m)  Wt 136 lb (61.689 kg)  BMI 24.68 kg/m2  LMP 06/08/2012  Body mass index is 24.68 kg/(m^2).  General appearance : Well developed well nourished female. No acute distress HEENT: Neck supple, trachea midline, no carotid bruits, no thyroidmegaly Lungs: Clear to auscultation, no rhonchi or wheezes, or rib retractions  Heart: Regular rate and rhythm, no murmurs or gallops Breast:Examined in sitting and supine position were symmetrical in appearance, no palpable masses or tenderness,  no skin retraction, no nipple inversion, no nipple discharge, no skin discoloration, no axillary or supraclavicular lymphadenopathy Abdomen: no palpable masses or tenderness, no rebound or guarding Extremities: no edema or skin discoloration or tenderness  Pelvic:  Bartholin, Urethra, Skene Glands: Within normal limits             Vagina: No gross lesions or discharge  Cervix: No gross lesions or discharge  Uterus  anteverted, normal size, shape and consistency, non-tender and mobile  Adnexa  Without masses or tenderness  Anus and perineum  normal   Rectovaginal  normal sphincter tone without palpated masses or tenderness             Hemoccult none indicated  Assessment/Plan:  47 y.o. female for annual exam who will receive the Tdap vaccine. Literature  information was provided in [place]. The following labs were ordered today: Hemoglobin A1c, TSH, screen cholesterol, urinalysis as well a CBC. No Pap smear done today the new screening guidelines were discussed. Patient was reminded to followup her mammogram the next few weeks and to continue doing monthly self breast examination. We discussed importance of calcium and vitamin D in regular exercise for osteoporosis prevention.    Ok Edwards MD, 4:36 PM 07/08/2012

## 2012-07-09 LAB — URINALYSIS W MICROSCOPIC + REFLEX CULTURE
Bilirubin Urine: NEGATIVE
Casts: NONE SEEN
Crystals: NONE SEEN
Glucose, UA: NEGATIVE mg/dL
Leukocytes, UA: NEGATIVE
Protein, ur: NEGATIVE mg/dL
Squamous Epithelial / LPF: NONE SEEN
pH: 6 (ref 5.0–8.0)

## 2012-07-09 LAB — HEMOGLOBIN A1C: Mean Plasma Glucose: 114 mg/dL (ref <117)

## 2012-08-03 ENCOUNTER — Ambulatory Visit
Admission: RE | Admit: 2012-08-03 | Discharge: 2012-08-03 | Disposition: A | Discharge status: Home / Self Care | Payer: PRIVATE HEALTH INSURANCE | Source: Ambulatory Visit

## 2012-08-03 DIAGNOSIS — Z1231 Encounter for screening mammogram for malignant neoplasm of breast: Secondary | ICD-10-CM

## 2013-10-20 ENCOUNTER — Other Ambulatory Visit: Payer: Self-pay

## 2013-10-20 DIAGNOSIS — Z1231 Encounter for screening mammogram for malignant neoplasm of breast: Secondary | ICD-10-CM

## 2013-11-09 ENCOUNTER — Ambulatory Visit
Admission: RE | Admit: 2013-11-09 | Discharge: 2013-11-09 | Disposition: A | Discharge status: Home / Self Care | Payer: PRIVATE HEALTH INSURANCE | Source: Ambulatory Visit

## 2013-11-09 DIAGNOSIS — Z1231 Encounter for screening mammogram for malignant neoplasm of breast: Secondary | ICD-10-CM

## 2013-11-10 ENCOUNTER — Other Ambulatory Visit (HOSPITAL_COMMUNITY)
Admission: RE | Admit: 2013-11-10 | Discharge: 2013-11-10 | Disposition: A | Discharge status: Home / Self Care | Payer: Commercial Managed Care - PPO | Source: Ambulatory Visit | Attending: Gynecology | Admitting: Gynecology

## 2013-11-10 ENCOUNTER — Encounter: Payer: Self-pay | Admitting: Gynecology

## 2013-11-10 ENCOUNTER — Ambulatory Visit (INDEPENDENT_AMBULATORY_CARE_PROVIDER_SITE_OTHER): Payer: Commercial Managed Care - PPO | Admitting: Gynecology

## 2013-11-10 VITALS — BP 117/72 | Ht 62.25 in | Wt 137.0 lb

## 2013-11-10 DIAGNOSIS — Z8632 Personal history of gestational diabetes: Secondary | ICD-10-CM

## 2013-11-10 DIAGNOSIS — Z01419 Encounter for gynecological examination (general) (routine) without abnormal findings: Secondary | ICD-10-CM | POA: Diagnosis not present

## 2013-11-10 DIAGNOSIS — N393 Stress incontinence (female) (male): Secondary | ICD-10-CM

## 2013-11-10 DIAGNOSIS — Z1151 Encounter for screening for human papillomavirus (HPV): Secondary | ICD-10-CM | POA: Insufficient documentation

## 2013-11-10 LAB — COMPREHENSIVE METABOLIC PANEL
ALBUMIN: 4.4 g/dL (ref 3.5–5.2)
ALK PHOS: 63 U/L (ref 39–117)
ALT: 12 U/L (ref 0–35)
AST: 19 U/L (ref 0–37)
BUN: 19 mg/dL (ref 6–23)
CO2: 28 mEq/L (ref 19–32)
Calcium: 9.4 mg/dL (ref 8.4–10.5)
Chloride: 102 mEq/L (ref 96–112)
Creat: 0.82 mg/dL (ref 0.50–1.10)
Glucose, Bld: 85 mg/dL (ref 70–99)
POTASSIUM: 4.5 meq/L (ref 3.5–5.3)
SODIUM: 136 meq/L (ref 135–145)
TOTAL PROTEIN: 7.1 g/dL (ref 6.0–8.3)
Total Bilirubin: 0.6 mg/dL (ref 0.2–1.2)

## 2013-11-10 LAB — CBC WITH DIFFERENTIAL/PLATELET
BASOS ABS: 0.1 10*3/uL (ref 0.0–0.1)
BASOS PCT: 1 % (ref 0–1)
Eosinophils Absolute: 0.1 10*3/uL (ref 0.0–0.7)
Eosinophils Relative: 2 % (ref 0–5)
HCT: 40.6 % (ref 36.0–46.0)
Hemoglobin: 13.6 g/dL (ref 12.0–15.0)
Lymphocytes Relative: 40 % (ref 12–46)
Lymphs Abs: 2.5 10*3/uL (ref 0.7–4.0)
MCH: 28.5 pg (ref 26.0–34.0)
MCHC: 33.5 g/dL (ref 30.0–36.0)
MCV: 85.1 fL (ref 78.0–100.0)
Monocytes Absolute: 0.4 10*3/uL (ref 0.1–1.0)
Monocytes Relative: 6 % (ref 3–12)
NEUTROS ABS: 3.2 10*3/uL (ref 1.7–7.7)
NEUTROS PCT: 51 % (ref 43–77)
Platelets: 289 10*3/uL (ref 150–400)
RBC: 4.77 MIL/uL (ref 3.87–5.11)
RDW: 13.9 % (ref 11.5–15.5)
WBC: 6.2 10*3/uL (ref 4.0–10.5)

## 2013-11-10 LAB — CHOLESTEROL, TOTAL: Cholesterol: 178 mg/dL (ref 0–200)

## 2013-11-10 LAB — TSH: TSH: 1.96 u[IU]/mL (ref 0.350–4.500)

## 2013-11-10 NOTE — Addendum Note (Signed)
Addended by: Berna SpareASTILLO, Donnella Morford A on: 11/10/2013 04:27 PM   Modules accepted: Orders

## 2013-11-10 NOTE — Patient Instructions (Signed)
Incontinencia urinaria ( Urinary Incontinence) La incontinencia urinaria es la prdida involuntaria de orina de la vejiga. CAUSAS  Hay muchas causas de incontinencia urinaria. Ellas son:  Medicamentos.  Infecciones.  Agrandamiento prosttico que causa un aumento del flujo de [place] de la vejiga.  Ciruga.  Enfermedades neurolgicas.  Factores emocionales. SIGNOS Y SNTOMAS Hay cuatro tipos de incontinencia urinaria: 1. Incontinencia urinaria de urgencia: es la prdida involuntaria de orina antes de llegar al bao. Hay una urgencia repentina de orinar pero no llega a tiempo al bao. 2. Incontinencia por estrs: es la prdida repentina de orina con cualquier actividad que fuerza a Art gallery manager orina. La causa ms frecuente son los cambios anatmicos en la pelvis y las zonas del esfnter. 3. Incontinencia por rebosamiento: es la prdida de orina por una obstruccin de la apertura de la vejiga. Esto causa un retroceso de la orina y una acumulacin de presin dentro de la vejiga. Cuando la presin dentro de la vejiga excede la presin que Engineer, manufacturing systems, la orina rebalsa y causa incontinencia, similar al desbordamiento de un dique. 4. Incontinencia total: es la prdida de orina como resultado de la incapacidad de Financial controller orina dentro de la vejiga. DIAGNSTICO  La evaluacin de la causa puede requerir:  Ardelia Mems historia mdica y obsttrica exhaustiva y [place].  Examen fsico completo.  Anlisis de laboratorio como un urocultivo y [place]. Cuando se indican estudios adicionales, pueden incluirse:  Una ecografa.  Radiografas de vejiga y riones.  Una cistoscopa. Consiste en un examen de la vejiga utilizando un telescopio pequeo.  Estudios urodinmicos para comprobar la funcin nerviosa de la vejiga y Social worker. TRATAMIENTO  El tratamiento de la incontinencia urinaria depende de la causa:  Para la incontinencia urinaria causada por una infeccin urinaria, le indicarn  antibiticos. Si la incontinencia urinaria se relaciona con los [employer] toma, el mdico podr Public relations account executive.  Para la incontinencia por estrs, en necesaria una ciruga para restablecer el soporte anatmico de la vejiga o el esfnter, o ambos, lo que Clinical research associate.  Para la incontinencia por rebosamiento causada por una prstata agrandada, una operacin para abrir el canal a travs de la prstata agrandada permitir que el flujo de orina salga de la vejiga. En las mujeres con fibromas, ser necesaria una histerectoma.  Para la incontinencia total, una ciruga del esfnter podr ayudar. Puede ser necesario colocar un esfnter urinario artificial (se coloca un manguito inflable alrededor de Geologist, engineering). En las mujeres que hayan desarrollado un pasaje similar a un orificio entre la vejiga y la vagina (fstula vesicovaginal ), ser necesario realizar una ciruga para cerrar la fstula. INSTRUCCIONES PARA EL CUIDADO EN EL HOGAR  La higiene diaria normal y el uso regular de apsitos o paales para adultos cambiados con regularidad ayudan a prevenir olores y daos en la piel.  Evite la cafena. Puede sobreestimular la vejiga.  Use el bao con regularidad. Trate de ir al bao cada 2  3 horas, aunque no sienta la necesidad. Tmese el tiempo para vaciar la vejiga completamente. Despus de orinar, espere un minuto. Luego trate de orinar nuevamente.  Para las causas que implican una disfuncin nerviosa, lleve un registro de los medicamentos que toma y un diario de las veces que va al bao. SOLICITE ATENCIN MDICA SI:  Despus del procedimiento, experimenta un empeoramiento del dolor en vez de mejorar.  La incontinencia empeora en vez de mejorar. SOLICITE ATENCIN MDICA DE INMEDIATO SI:  Le sube la fiebre o tiene escalofros.  No puede orinar.  Observa irritacin en la ingle o baja hacia los muslos. ASEGRESE DE QUE:   Comprende estas instrucciones.   Controlar su  afeccin.  Recibir ayuda de inmediato si no mejora o si empeora. Document Released: 01/06/2005 Document Revised: 09/08/2012 [facility] LLCExitCare Patient Information 2015 [place]woodExitCare, MarylandLLC. This information is not intended to replace advice given to you by your health care provider. Make sure you discuss any questions you have with your health care provider. Estudios urodinmicos (Urodynamic Testing) Si tiene un problema de prdida de [place]orina, los estudios urodinmicos pueden ser de Wisterutilidad. Estos estudios no invasivos sern de ayuda para Emergency planning/management officeraveriguar la causa del problema. El envejecimiento, las enfermedades o las lesiones pueden causar problemas en el aparato urinario. Los msculos de los urteres, la vejiga y la uretra pueden debilitarse con la edad. Pueden aumentar las infecciones urinarias y los clculos en la vejiga debido al debilitamiento de los msculos de la vejiga. Probablemente, los msculos no pueden vaciar la vejiga por completo. Adems, el debilitamiento del esfnter uretral y los msculos del suelo plvico pueden causar incontinencia urinaria de esfuerzo. Esto se debe a que Architectla contraccin del esfnter no alcanza para Photographermantener la orina en el vejiga o a que Programmer, multimediael esfnter no tiene suficiente sostn de los msculos plvicos. La urodinmica es el estudio de la forma en que organismo almacena y excreta la Areciboorina. Estos estudios ayudan al mdico a ver el funcionamiento de los msculos de la vejiga y Nurse, mental healthdel esfnter, y pueden ser tiles para Hess Corporationexplicar los siguientes sntomas:  Imposibilidad de Scientist, physiologicalcontrolar la orina (incontinencia).  Urgencia repentina e intensa de orinar.  Dolor al Beatrix Shipperorinar.  Infecciones urinarias recurrentes.  Ganas de orinar con frecuencia.  Problemas para comenzar a Producer, television/film/videoeliminar la orina.  Problemas para vaciar completamente la vejiga. PREPARACIN PARA EL ESTUDIO Si el mdico recomienda un estudio de vejiga, por lo general no se necesita una preparacin especial. Asegrese de entender las  indicaciones que recibe. En funcin del estudio, tal vez se le pida que concurra con la vejiga llena o vaca. Adems, pregunte si debe cambiar la dieta o saltear los medicamentos que toma regularmente y durante cunto Rapid Citytiempo. RESPECTO DEL ESTUDIO Cualquier procedimiento diseado para brindar informacin sobre un problema de vejiga puede denominarse estudio urodinmico. Su mdico querr saber lo siguiente:  Si tiene dificultad para comenzar a Art gallery managereliminar la orina.  Qu tanto tiene que esforzarse para Geographical information systems officerorinar.  Si el chorro de Comorosorina se interrumpe.  Si le quedan restos de Comorosorina en la vejiga cuando termina (orina residual). Los estudios urodinmicos pueden abarcar desde la simple observacin Marsh & McLennanhasta las mediciones exactas mediante el uso de instrumentos. MTODOS DE REALIZACIN DEL ESTUDIO El tipo de estudio que se realiza depende del problema que tiene. Los Engelhard Corporationdiferentes estudios se describen a continuacin.  El uso de un aparato de diagnstico por imgenes. Este aparato filma la miccin.  Orinar detrs de una cortina mientras un mdico o enfermero escuchan.  Si el problema es la prdida, la prueba de la compresa es un mtodo sencillo de medir la cantidad de orina que gotea. Se le entregar una cantidad de compresas absorbentes y bolsas de plstico. Se le indicar que use la compresa a lo largo de 1 o 2horas y Engineer, miningluego la coloque en Neomia Dearuna bolsa y Information systems managerla cierre. Luego, su mdico pesar las bolsas para determinar la cantidad de orina que qued en la compresa. Un mtodo ms sencillo pero menos exacto es que cambie las compresas con la frecuencia que sea Willownecesaria, y Radio producerhacer  un seguimiento de la cantidad que [place]ausa por C.H. Robinson Worldwideda.  Tambin se Medical sales representativerealizar un examen fsico para Sales promotion account executivedescartar otras causas de los problemas urinarios que podran incluir el debilitamiento de los msculos plvicos (prolapso de la pelvis) o el agrandamiento de la prstata.  Estudio de presin y flujo: vaciar la vejiga de modo que un catter pueda medir las  presiones necesarias para Geographical information systems officerorinar. El estudio ayuda a identificar las causas de la obstruccin infravesical que los hombres pueden presentar cuando hay agrandamiento de la prstata. La obstruccin infravesical es menos frecuente en las mujeres, pero puede ocurrir con la hernia de vejiga o rara vez despus de un procedimiento quirrgico por incontinencia urinaria.  Electromiografa (medicin de los impulsos nerviosos): si su mdico piensa que el problema urinario que padece guarda relacin con una lesin nerviosa, tal vez le hagan una electromiografa. Este estudio mide la actividad muscular del esfnter uretral. Se colocan sensores sobre la piel cerca de la uretra y del recto. La actividad muscular se registra en un aparato. Los patrones de los impulsos muestran si los mensajes que se envan a la vejiga y la uretra estn correctamente coordinados.  Videourodinamia: puede realizarse con o sin un aparato que toma imgenes de la vejiga durante el llenado y [place] vaciado. El aparato de diagnstico por imgenes puede usar rayosX u ondas sonoras. Si se [place]ausa un aparato de rayosX, el lquido que se emplea para llenar la vejiga puede ser un medio de contraste que aparecer en la radiografa. Las Leggett & Plattimgenes y los videos muestran el tamao y la forma del tracto urinario, y Egyptayudan a que su mdico entienda cul es el Old Jamestownproblema. DESPUS DEL ESTUDIO Puede tener un malestar leve durante algunas horas despus de Rugbyestos estudios. AutoZoneBeber dos vasos de agua de 8onzas (240ml) por hora durante 2horas puede ser de [place]iskayuda. Pregntele a su mdico si puede darse un bao tibio. Si no es as, tal vez pueda colocarse un pao hmedo tibio sobre el orificio uretral, ya que esto puede Acupuncturistaliviar el malestar. Pueden indicarle que tome antibiticos para prevenir una infeccin. Llame a su mdico si tiene signos de infeccin, entre otros, Cooterdolor, escalofros o Talentfiebre. OBTENCIN DE LOS RESULTADOS DE LAS PRUEBAS Puede analizar los Terex Corporationresultados de los  estudios sencillos con su mdico inmediatamente despus de su realizacin. Los Norfolk Southernresultados de otros estudios pueden tomar Time Warneralgunos das. Tendr tiempo para hacer preguntas Dole Foodsobre los resultados y los posibles tratamientos para su problema. Es su responsabilidad retirar el resultado del Genoaestudio. Consulte en el laboratorio cundo y cmo podr Starbucks Corporationobtener los resultados. Irven ShellingPARA OBTENER MS INFORMACIN  Asociacin Rodena GoldmannUrolgica Estadounidense (American Urological Association): www.auanet.org Fundacin Norteamericana para las Teacher, musicnfermedades Urolgicas (McGraw-Hillmerican Foundation for Urologic Disease): www.afud.org Asociacin Estadounidense para la Cistitis Intersticial (Interstitial Cystitis Association of Mozamb[place]America): https://www.blake-white.com/www.ichelp.org CMS Energy CorporationCentro Nacional de Informacin sobre Enfermedades Renales y Music therapistUrolgicas (National Kidney and Urologic Diseases Information Clearinghouse): nkudic@info .StageSync.siniddk.nih.gov Document Released: 10/27/2012 [facility] PLLCExitCare Patient Information 2015 University of California-Santa BarbaraExitCare, MarylandLLC. This information is not intended to replace advice given to you by your health care provider. Make sure you discuss any questions you have with your health care provider.

## 2013-11-10 NOTE — Progress Notes (Signed)
Suzanne SineRosana Harrington 1965/04/21 [id number]   History:    48 y.o.  for annual gyn exam  Is only complaint is of leaking of urine when she coughs or sneeze. Patient had this issue several years ago and she was given information and she attempted Kegel exercises. She denies any nocturia. Patient with no past history of abnormal Pap smears. Patient having normal menstrual cycles. Patient declined flu vaccine. Patient had Tdap vaccine in 2014. Patient with past history of gestational diabetes.  Past medical history,surgical history, family history and social history were all reviewed and documented in the EPIC chart.  Gynecologic History Patient's last menstrual period was 11/05/2013. Contraception: tubal ligation Last Pap: 2012. Results were: normal Last mammogram: 2015. Results were: normal  Obstetric History OB History  Gravida Para Term Preterm AB SAB TAB Ectopic Multiple Living  6 5 5  1 1    5     # Outcome Date GA Lbr Len/2nd Weight Sex Delivery Anes PTL Lv  6 SAB           5 TRM     M SVD  N Y  4 TRM     F SVD  N Y  3 TRM     F SVD  N Y  2 TRM     F SVD  N Y  1 TRM     F SVD  N Y       ROS: A ROS was performed and pertinent positives and negatives are included in the history.  GENERAL: No fevers or chills. HEENT: No change in vision, no earache, sore throat or sinus congestion. NECK: No pain or stiffness. CARDIOVASCULAR: No chest pain or pressure. No palpitations. PULMONARY: No shortness of breath, cough or wheeze. GASTROINTESTINAL: No abdominal pain, nausea, vomiting or diarrhea, melena or bright red blood per rectum. GENITOURINARY: No urinary frequency, urgency, hesitancy or dysuria. MUSCULOSKELETAL: No joint or muscle pain, no back pain, no recent trauma. DERMATOLOGIC: No rash, no itching, no lesions. ENDOCRINE: No polyuria, polydipsia, no heat or cold intolerance. No recent change in weight. HEMATOLOGICAL: No anemia or easy bruising or bleeding. NEUROLOGIC: No headache, seizures,  numbness, tingling or weakness. PSYCHIATRIC: No depression, no loss of interest in normal activity or change in sleep pattern.     Exam: chaperone present  BP 117/72  Ht 5' 2.25" (1.581 m)  Wt 137 lb (62.143 kg)  BMI 24.86 kg/m2  LMP 11/05/2013  Body mass index is 24.86 kg/(m^2).  General appearance : Well developed well nourished female. No acute distress HEENT: Neck supple, trachea midline, no carotid bruits, no thyroidmegaly Lungs: Clear to auscultation, no rhonchi or wheezes, or rib retractions  Heart: Regular rate and rhythm, no murmurs or gallops Breast:Examined in sitting and supine position were symmetrical in appearance, no palpable masses or tenderness,  no skin retraction, no nipple inversion, no nipple discharge, no skin discoloration, no axillary or supraclavicular lymphadenopathy Abdomen: no palpable masses or tenderness, no rebound or guarding Extremities: no edema or skin discoloration or tenderness  Pelvic:  Bartholin, Urethra, Skene Glands: Within normal limits             Vagina: No gross lesions or discharge  Cervix: No gross lesions or discharge  Uterus  anteverted, normal size, shape and consistency, non-tender and mobile  Adnexa  Without masses or tenderness  Anus and perineum  normal   Rectovaginal  normal sphincter tone without palpated masses or tenderness  Hemoccult not indicated   Q-tip angle tests less than 30 Patient did not leak urine in the supine position when she coughs or beared  Down With the patient was and and she was asked to having cough she didn't leak urine There was no evidence of cystocele or rectocele or uterine prolapse pelvic relaxation  Assessment/Plan:  48 y.o. female for annual exam with stress urinary incontinence. Patient will be scheduled for urodynamic evaluation. Literature information was provided in Spanish on urinary incontinence as well as a urodynamic testing. Her Pap smear was done today. The following labs  were ordered today: Screening cholesterol, hemoglobin A1c, competence metabolic panel, TSH, CBC, and urinalysis.   Ok EdwardsFERNANDEZ,Chandi Nicklin H MD, 4:11 PM 11/10/2013

## 2013-11-11 ENCOUNTER — Other Ambulatory Visit: Payer: Self-pay | Admitting: Gynecology

## 2013-11-11 DIAGNOSIS — R7309 Other abnormal glucose: Secondary | ICD-10-CM

## 2013-11-11 LAB — URINALYSIS W MICROSCOPIC + REFLEX CULTURE
BACTERIA UA: NONE SEEN
BILIRUBIN URINE: NEGATIVE
Casts: NONE SEEN
Crystals: NONE SEEN
Glucose, UA: NEGATIVE mg/dL
Hgb urine dipstick: NEGATIVE
KETONES UR: NEGATIVE mg/dL
Leukocytes, UA: NEGATIVE
NITRITE: NEGATIVE
PROTEIN: NEGATIVE mg/dL
SQUAMOUS EPITHELIAL / LPF: NONE SEEN
Specific Gravity, Urine: 1.017 (ref 1.005–1.030)
UROBILINOGEN UA: 0.2 mg/dL (ref 0.0–1.0)
pH: 6 (ref 5.0–8.0)

## 2013-11-11 LAB — HEMOGLOBIN A1C
HEMOGLOBIN A1C: 5.8 % — AB (ref <5.7)
MEAN PLASMA GLUCOSE: 120 mg/dL — AB (ref <117)

## 2013-11-14 LAB — CYTOLOGY - PAP

## 2013-11-21 ENCOUNTER — Encounter: Payer: Self-pay | Admitting: Gynecology

## 2014-05-02 ENCOUNTER — Ambulatory Visit (INDEPENDENT_AMBULATORY_CARE_PROVIDER_SITE_OTHER): Payer: Commercial Managed Care - PPO | Admitting: Gynecology

## 2014-05-02 DIAGNOSIS — N393 Stress incontinence (female) (male): Secondary | ICD-10-CM | POA: Diagnosis not present

## 2014-05-03 ENCOUNTER — Other Ambulatory Visit: Payer: Commercial Managed Care - PPO

## 2014-05-11 ENCOUNTER — Encounter: Payer: Self-pay | Admitting: Gynecology

## 2014-05-11 ENCOUNTER — Ambulatory Visit (INDEPENDENT_AMBULATORY_CARE_PROVIDER_SITE_OTHER): Payer: Commercial Managed Care - PPO | Admitting: Gynecology

## 2014-05-11 VITALS — BP 116/78

## 2014-05-11 DIAGNOSIS — N3941 Urge incontinence: Secondary | ICD-10-CM | POA: Diagnosis not present

## 2014-05-11 DIAGNOSIS — N3281 Overactive bladder: Secondary | ICD-10-CM

## 2014-05-11 MED ORDER — TOLTERODINE TARTRATE ER 2 MG PO CP24: 2.0000 mg | ORAL_CAPSULE | Freq: Every day | ORAL | Status: DC

## 2014-05-11 NOTE — Patient Instructions (Signed)
Tolterodine extended-release capsules Qu es este medicamento? La TOLTERODINA se utiliza para tratar la vejiga hiperactiva. Este medicamento puede reducir la necesidad de orinar con frecuencia. Tambin puede ayudar a evitar que una persona se orine accidentalmente. Este medicamento puede ser utilizado para otros usos; si tiene alguna pregunta consulte con su proveedor de atencin mdica o con su farmacutico. MARCAS COMERCIALES DISPONIBLES: Detrol LA Qu le debo informar a mi profesional de la salud antes de tomar este medicamento? Necesita saber si usted presenta alguno de los siguientes problemas o situaciones: -dificultad para orinar -glaucoma -obstruccin intestinal -pulso cardiaco irregular o tiene un miembro de su familia que tiene pulso cardiaco irregular -enfermedad renal -enfermedad heptica -miastenia gravis -una reaccin alrgica o inusual a la tolterodina, fesoterodina, a otros medicamentos, alimentos, colorantes o conservantes -si est embarazada o buscando quedar embarazada -si est amamantando a un beb Cmo debo utilizar este medicamento? Tome este medicamento por va oral con un vaso de agua. Ingiralo entero, no lo triture, corte ni mastique. Siga las instrucciones de la etiqueta del medicamento. Tome sus dosis a intervalos regulares. No tome su medicamento con una frecuencia mayor a la indicada. Hable con su pediatra para informarse acerca del uso de este medicamento en nios. Puede requerir atencin especial. Sobredosis: Pngase en contacto inmediatamente con un centro toxicolgico o una sala de urgencia si usted cree que haya tomado demasiado medicamento. ATENCIN: Este medicamento es solo para usted. No comparta este medicamento con nadie. Qu sucede si me olvido de una dosis? Si olvida una dosis, tmela lo antes posible. Si es casi la hora de la prxima dosis, tome slo esa dosis. No tome dosis adicionales o dobles. Qu puede interactuar con este  medicamento? -claritromicina -ciclosporina -eritromicina -fluoxetina -medicamentos para infecciones micticas, como fluconazol, itraconazol, quetoconazol o voriconazol -medicamentos para problemas de memoria, como galantamina, donepezil, tacrina -vinblastina Puede ser que esta lista no menciona todas las posibles interacciones. Informe a su profesional de la salud de todos los productos a base de hierbas, medicamentos de venta libre o suplementos nutritivos que est tomando. Si usted fuma, consume bebidas alcohlicas o si utiliza drogas ilegales, indqueselo tambin a su profesional de la salud. Algunas sustancias pueden interactuar con su medicamento. A qu debo estar atento al usar este medicamento? Pueden transcurrir 2  3 meses antes de que obtenga el beneficio mximo de este medicamento. Su profesional de la salud tambin le puede recomendar algunas tcnicas que pueden ayudar a mejorar el control de su vejiga y de los msculos del esfnter. Estas tcnicas le ayudarn a reducir la frecuencia con que debe ir al bao. Es posible que sea necesario limitar la ingesta de t, caf, refrescos con cafena y alcohol. Estas bebidas pueden empeorar los sntomas. Si sigue hbitos saludables para los intestinos, los sntomas de la vejiga pueden disminuir. Si fuma, deje de hacerlo; de esta manera ayudar a reducir la irritacin de los msculos de la vejiga. Puede experimentar mareos o somnolencia. No conduzca ni utilice maquinaria, ni haga nada que le exija permanecer en estado de alerta hasta que sepa cmo le afecta este medicamento. No se siente ni se ponga de pie con rapidez, especialmente si es un paciente de edad avanzada. Esto ayuda a reducir el riesgo de mareos o desmayos. Se le podr secar la boca. Masticar chicle sin azcar, chupar caramelos duros y beber agua en abundancia le ayudar a mantener la boca hmeda. Este medicamento puede resecarle los ojos y provocar visin borrosa. Si usa lentes de  contacto, puede sentir ciertas   molestias. Las gotas lubricantes pueden ser tiles. Si el problema no desaparece o es severo, consulte a su mdico de los ojos. Qu efectos secundarios puedo tener al Boston Scientificutilizar este medicamento? Efectos secundarios que debe informar a su mdico o a Producer, television/film/videosu profesional de la salud tan pronto como sea posible: -Therapist, artreacciones alrgicas como erupcin cutnea, picazn o urticarias, hinchazn de la cara, labios o lengua -problemas respiratorios -confusin -dificultad para orinar -pulso cardaco rpido, irregular -alucinaciones -problemas con la memoria -hinchazn de pies, manos Efectos secundarios que, por lo general, no requieren atencin mdica (debe informarlos a su mdico o a su profesional de la salud si persisten o si son molestos): -cambios en la visin -estreimiento -boca, ojos secos -dolor de cabeza -Richrd Soxmareo, somnolencia -Programme researcher, broadcasting/film/videomalestar estomacal Puede ser que esta lista no menciona todos los posibles efectos secundarios. Comunquese a su mdico por asesoramiento mdico Hewlett-Packardsobre los efectos secundarios. Usted puede informar los efectos secundarios a la FDA por telfono al 1-800-FDA-1088. Dnde debo guardar mi medicina? Mantngala fuera del alcance de los nios. Gurdela a Sanmina-SCItemperatura ambiente, entre 15 y 30 grados C ([id number] y 5386 grados F). Protjala de luz. Deseche los medicamentos que no haya utilizado, despus de la fecha de vencimiento. ATENCIN: Este folleto es un resumen. Puede ser que no cubra toda la posible informacin. Si usted tiene preguntas acerca de esta medicina, consulte con su mdico, su farmacutico o su profesional de Radiographer, therapeuticla salud.  2015, Elsevier/Gold Standard. (2009-11-14 14:09:16)

## 2014-05-11 NOTE — Progress Notes (Signed)
   Patient presented to the office today to discuss her urodynamic evaluation that was recently done in our office as a result of patient's complaint of urinary incontinence. On further questioning today she stated that she feels like she has to go frequently if not she would leak urine. She seldom has to get up at night. Which she was examined in the office back in October 2015 the following observation was noted:  Q-tip angle tests less than 30 Patient did not leak urine in the supine position when she coughs or beared Down With the patient was and and she was asked to having cough she didn't leak urine There was no evidence of cystocele or rectocele or uterine prolapse pelvic relaxation  Urodynamic evaluation demonstrated the following: The study was performed on 05/02/2014. Complex uroflowmetry was normal and demonstrated no evidence of obstructive uropathy. Her post void residual was normal at 30 cc. Valsalva leak point pressures were equivocal. The patient's vesicle pressure demonstrated normal compliance from start of filling to the end of infusion during multichannel CMG. The patient sensory volumes were normal (first sensation 90-150 cc, second sensation 200-300 cc, third sensation 400-550 cc). Maximum urethral closure pressure (greater than 30 cm of water) were within normal range and did not demonstrate any intrinsic sphincter deficiency. A 3 channel CMG with flow was within normal limits given the testing situation. (Peak flow 20-30 mL per second with PVR of 25% or less the voided volume)  Assessment/plan: Patient leak 150 cc and 250 cc. No detrusor dyssynergia was noted but based on patient's history she may have underlying overactive bladder that was not demonstrated on this evaluation. We discussed of given her a trial of anti-cholinergic such as with Detrol LA 2 mg daily and monitor symptoms over the course of the next 3 months. If her symptoms do persist I'm going to refer her to the  urologist. She would like to proceed with this route. Literature information was provided. Greater than 50% of the time was spent in counseling and coordination of care with the patient as discussed above. Total time spent 15 minutes.

## 2014-12-25 ENCOUNTER — Other Ambulatory Visit: Payer: Self-pay

## 2014-12-25 DIAGNOSIS — Z1231 Encounter for screening mammogram for malignant neoplasm of breast: Secondary | ICD-10-CM

## 2014-12-27 ENCOUNTER — Ambulatory Visit
Admission: RE | Admit: 2014-12-27 | Discharge: 2014-12-27 | Disposition: A | Discharge status: Home / Self Care | Payer: Commercial Managed Care - PPO | Source: Ambulatory Visit

## 2014-12-27 DIAGNOSIS — Z1231 Encounter for screening mammogram for malignant neoplasm of breast: Secondary | ICD-10-CM

## 2015-02-01 ENCOUNTER — Encounter: Payer: Commercial Managed Care - PPO | Admitting: Gynecology

## 2015-02-01 ENCOUNTER — Telehealth: Payer: Self-pay | Admitting: *Deleted

## 2015-02-01 ENCOUNTER — Ambulatory Visit (INDEPENDENT_AMBULATORY_CARE_PROVIDER_SITE_OTHER): Payer: Commercial Managed Care - PPO | Admitting: Gynecology

## 2015-02-01 ENCOUNTER — Encounter: Payer: Self-pay | Admitting: Gynecology

## 2015-02-01 VITALS — BP 124/80 | Ht 63.0 in | Wt 142.6 lb

## 2015-02-01 DIAGNOSIS — Z01419 Encounter for gynecological examination (general) (routine) without abnormal findings: Secondary | ICD-10-CM | POA: Diagnosis not present

## 2015-02-01 DIAGNOSIS — N3281 Overactive bladder: Secondary | ICD-10-CM | POA: Diagnosis not present

## 2015-02-01 MED ORDER — FESOTERODINE FUMARATE ER 4 MG PO TB24: 4.0000 mg | ORAL_TABLET | Freq: Every day | ORAL | Status: DC

## 2015-02-01 NOTE — Telephone Encounter (Signed)
-----   Message from Ok EdwardsJuan H Fernandez, MD sent at 02/01/2015 11:57 AM EST ----- Please schedule appt with [facility] for this patient with persistent OAB despite treatment. Send them a copy of today's visit after I finish dictation.

## 2015-02-01 NOTE — Telephone Encounter (Signed)
Notes faxed to [facility], they will fax me back with time and date. 

## 2015-02-01 NOTE — Patient Instructions (Signed)
La menopausia (Menopause) La menopausia es el perodo normal de la vida en el cual los perodos menstruales cesan por completo. Se completa cuando no se tienen 12 perodos menstruales consecutivos. Ocurre generalmente en mujeres entre los 40 y los 55 aos. Muy rara vez una mujer tiene la menopausia antes de los 40 aos. En la menopausia, los ovarios dejan de producir las hormonas femeninas estrgeno y progesterona. Esto puede causar sntomas indeseables y tambin afectar a la salud. A veces los sntomas aparecen entre 4 y 5 aos antes de que comience la menopausia. No existe una relacin entre la menopausia y:  Los anticonceptivos orales.  La cantidad de hijos que tuvo.  La raza.  La edad en que comenzaron los perodos menstruales (menarca). Las mujeres que fuman mucho y las que son muy delgadas pueden desarrollar la menopausia a una edad ms temprana. CAUSAS  Los ovarios dejan de producir las hormonas femeninas estrgeno y progesterona.  Otras causas son:  Ciruga en la que se extirpan ambos ovarios.  Los ovarios que dejan de funcionar sin un motivo conocido.  Tumores de la glndula pituitaria en el cerebro.  Enfermedades que afectan los ovarios y la produccin de hormonas.  Radioterapia en el abdomen o la pelvis.  Quimioterapia que afecte a los ovarios. SNTOMAS   Acaloramiento.  Sudoracin nocturna.  Disminucin del deseo sexual.  Sequedad vaginal y adelgazamiento de la vagina, lo que causa relaciones sexuales dolorosas.  Sequedad de la piel y aparicin de arrugas.  Dolores de cabeza.  Cansancio.  Irritabilidad.  Problemas de memoria.  Aumento de peso.  Infeccin de la vejiga.  Aparicin de vello en el rostro y el pecho.  Infertilidad. Los sntomas ms graves pueden incluir:  Prdida de masa sea osteoporosis) que favorece la rotura de huesos(fracturas).  Depresin.  Endurecimiento y estrechamiento de las arterias (aterosclerosis) lo que puede causar  infarto e ictus. DIAGNSTICO   El perodo menstrual no aparece por 12 meses seguidos.  Examen fsico.  Estudios hormonales de la sangre. TRATAMIENTO  Hay muchas opciones de tratamiento y casi tantas dudas como opciones existen. La decisin de tratar o no los cambios que trae la menopausia es una decisin que realiza el profesional de acuerdo con cada persona en particular. El profesional comentar las opciones de tratamiento con usted. Juntos podrn decidir qu tratamiento es el mejor para usted. Las opciones de tratamiento pueden incluir:   Terapia hormonal (estrgenos y progesterona).  Medicamentos no hormonales.  Tratamiento de los sntomas individuales con medicamentos (por ejemplo antidepresivos para la depresin).  Algunos medicamentos herbales pueden ayudar en sntomas especficos.  Psicoterapia con un psiclogo o psiquiatra.  Terapia grupal.  Cambios en el estilo de vida, como:  Consumir una dieta saludable.  Actividad fsica regular.  Limitar el consumo de cafena y alcohol.  Control del estrs y meditacin.  No realizar tratamiento. INSTRUCCIONES PARA EL CUIDADO EN EL HOGAR   Tome todos los medicamentos como el mdico le indique.  Descanse y duerma lo suficiente.  Haga ejercicios regularmente.  Consuma una dieta que contenga calcio (bueno para los huesos) y productos derivados de la soja (actan como estrgenos).  Evite las bebidas alcohlicas.  No fume.  Si tiene sofocones, vstase en capas.  Tome suplementos de calcio y vitamina D para fortalecer los huesos.  Puede utilizar lubricantes y humectantes de venta libre para la sequedad vaginal.  En algunos casos la terapia de grupo podr ayudarla.  La acupuntura puede ser de ayuda en ciertos casos. SOLICITE ATENCIN MDICA   SI:   No est segura de estar en la menopausia.  Tiene sntomas de Rwandamenopausia y Angolanecesita asesoramiento y TEFL teachertratamiento.  Tiene [age] aos y an tiene Environmental managerperodos menstruales.  Tiene  dolor durante las The St. Paul Travelersrelaciones sexuales.  La menopausia se ha completado (no ha tenido el perodo menstrual durante 12 meses) y tiene un sangrado vaginal.  Necesita ser derivada a un especialista (gineclogo, psiquiatra, o psiclogo) para Pensions consultantrealizar un tratamiento. SOLICITE ATENCIN MDICA DE INMEDIATO SI:   Siente una depresin intensa e incontrolable.  Tiene una hemorragia vaginal abundante.  Siente y cree que se ha fracturado un hueso.  Siente dolor al ConocoPhillipsorinar.  Siente dolor en el pecho o en la pierna.  Tiene latidos cardacos rpidos (palpitaciones).  Siente dolor de cabeza intenso.  Tiene problemas de visin.  Siente un bulto en la mama.  Siente un dolor abdominal intenso o indigestin.   Esta informacin no tiene Theme park managercomo fin reemplazar el consejo del mdico. Asegrese de hacerle al mdico cualquier pregunta que tenga.   Document Released: 02/17/2006 Document Revised: 09/08/2012 Elsevier Interactive Patient Education 2016 ArvinMeritorElsevier Inc.   Colonoscopa (Colonoscopy) Neomia DearUna colonoscopa es un examen que se realiza para examinar todo el intestino grueso (colon). Este examen puede ayudar a Engineer, manufacturingdetectar problemas, como tumores, plipos, inflamacin y reas de Restaurant manager, fast foodhemorragia. El examen dura aproximadamente 1hora.  INFORME A SU MDICO:   Cualquier alergia que tenga.  Todos los Walt Disneymedicamentos que utiliza, incluidos vitaminas, hierbas, gotas oftlmicas, cremas y [address] Stmedicamentos de [address] Northventa libre.  Problemas previos que usted o los Graybar Electricmiembros de su familia hayan tenido con el uso de anestsicos.  Enfermedades de Clear Channel Communicationsla sangre.  Cirugas previas.  Enfermedades patolgicas. RIESGOS Y COMPLICACIONES  En general, se trata de un procedimiento seguro. Sin embargo, Tree surgeoncomo en cualquier procedimiento, pueden surgir complicaciones. Las complicaciones posibles son:  Hemorragias.  Desgarro o ruptura de la pared del colon.  Reaccin a los Systems developermedicamentos administrados durante el examen.  Infeccin (raro). ANTES  DEL PROCEDIMIENTO   Consulte a su mdico si debe cambiar o suspender los medicamentos que toma habitualmente.  Posiblemente se le recete una preparacin del colon por va oral. Esto incluye beber una gran cantidad de lquido medicinal desde el da anterior a su procedimiento. El lquido har que elimine muchas heces blandas hasta que sean casi claras o de color verdoso claro. De esta manera limpiar el colon para prepararlo para el procedimiento.  No coma ni beba nada ms una vez que haya comenzado con la preparacin del colon, a menos que el mdico le indique que es seguro Adamshacerlo.  Pdale a alguna persona que la lleve a su casa luego del procedimiento. PROCEDIMIENTO   Le administrarn un medicamento para que pueda relajarse (sedante).  Se recostar de costado con las rodillas flexionadas.  Se insertar un tubo largo y flexible con Neomia Dearuna luz y Neomia Dearuna cmara en el extremo (colonoscopio) a travs del recto y dentro del colon. La cmara enviar el video hacia una pantalla de computadora a medida que se vaya moviendo por el colon. El colonoscopio tambin libera dixido de carbono para inflar el colon. Esto ayuda a que el mdico pueda ver mejor el rea.  Durante el examen, es posible que su mdico tome una pequea muestra de tejido (biopsia) para examinarla bajo el microscopio, si se encuentran anormalidades.  El examen finaliza cuando se ha examinado todo el colon. DESPUS DEL PROCEDIMIENTO   No conduzca vehculos durante las 24horas posteriores al examen.  Es posible que encuentre una pequea cantidad de sangre en la  fecal.  Quizs tenga cantidades moderadas de gases y calambres o hinchazn abdominales leves. Esto se produce a causa del gas utilizado para inflar el colon durante el examen.  Pregunte cundo estarn listos los resultados del examen y cmo los obtendr. Asegrese de obtener los resultados.   Esta informacin no tiene como fin reemplazar el consejo del mdico. Asegrese  de hacerle al mdico cualquier pregunta que tenga.   Document Released: 10/16/2004 Document Revised: 10/27/2012 Elsevier Interactive Patient Education 2016 Elsevier Inc.  

## 2015-02-01 NOTE — Progress Notes (Signed)
Suzanne SineRosana Harrington 1965/09/01 [id number]   History:    50 y.o.  for annual gyn exam who in 2015 a complaint of leaking urine when she coughed or sneezed. This is no longer an issue since last year she been complaining of urgency but no nocturia but would only leak urine if she did not make it to the bathroom on time. She presented been given information on Kegel  exercises. She also was started on Detrol 2 mg by mouth daily which she stated she noticed no change in symptoms after 3 months of the medication so she discontinued. Review of my office note from last year indicated the following evaluation urine the office:  Q-tip angle tests less than 30 Patient did not leak urine in the supine position when she coughs or beared Down With the patient was and and she was asked to having cough she didn't leak urine There was no evidence of cystocele or rectocele or uterine prolapse pelvic relaxation  Urodynamic evaluation demonstrated the following: The study was performed on 05/02/2014. Complex uroflowmetry was normal and demonstrated no evidence of obstructive uropathy. Her post void residual was normal at 30 cc. Valsalva leak point pressures were equivocal. The patient's vesicle pressure demonstrated normal compliance from start of filling to the end of infusion during multichannel CMG. The patient sensory volumes were normal (first sensation 90-150 cc, second sensation 200-300 cc, third sensation 400-550 cc). Maximum urethral closure pressure (greater than 30 cm of water) were within normal range and did not demonstrate any intrinsic sphincter deficiency. A 3 channel CMG with flow was within normal limits given the testing situation. (Peak flow 20-30 mL per second with PVR of 25% or less the voided volume)  Patient's last menstrual period was over a year ago. She denies any vasomotor symptoms. Patient with no previous history of any abnormal Pap smear.   Past medical history,surgical history, family  history and social history were all reviewed and documented in the EPIC chart.  Gynecologic History Patient's last menstrual period was 01/31/2014. Contraception: post menopausal status Last Pa2015 Results were: normal Last mammogra2016 Results were: normal but dense   Obstetric History OB History  Gravida Para Term Preterm AB SAB TAB Ectopic Multiple Living  6 5 5  1 1    5     # Outcome Date GA Lbr Len/2nd Weight Sex Delivery Anes PTL Lv  6 SAB           5 Term     M Vag-Spont  N Y  4 Term     F Vag-Spont  N Y  3 Term     F Vag-Spont  N Y  2 Term     F Vag-Spont  N Y  1 Term     F Vag-Spont  N Y       ROS: A ROS was performed and pertinent positives and negatives are included in the history.  GENERAL: No fevers or chills. HEENT: No change in vision, no earache, sore throat or sinus congestion. NECK: No pain or stiffness. CARDIOVASCULAR: No chest pain or pressure. No palpitations. PULMONARY: No shortness of breath, cough or wheeze. GASTROINTESTINAL: No abdominal pain, nausea, vomiting or diarrhea, melena or bright red blood per rectum. GENITOURINARY: No urinary frequency, urgency, hesitancy or dysuria. MUSCULOSKELETAL: No joint or muscle pain, no back pain, no recent trauma. DERMATOLOGIC: No rash, no itching, no lesions. ENDOCRINE: No polyuria, polydipsia, no heat or cold intolerance. No recent change in weight. HEMATOLOGICAL: No anemia  or easy bruising or bleeding. NEUROLOGIC: No headache, seizures, numbness, tingling or weakness. PSYCHIATRIC: No depression, no loss of interest in normal activity or change in sleep pattern.     Exam: chaperone present  BP 124/80 mmHg  Ht 5\' 3"  (1.6 m)  Wt 142 lb 9.6 oz (64.683 kg)  BMI 25.27 kg/m2  LMP 01/31/2014  Body mass index is 25.27 kg/(m^2).  General appearance : Well developed well nourished female. No acute distress HEENT: Eyes: no retinal hemorrhage or exudates,  Neck supple, trachea midline, no carotid bruits, no  thyroidmegaly Lungs: Clear to auscultation, no rhonchi or wheezes, or rib retractions  Heart: Regular rate and rhythm, no murmurs or gallops Breast:Examined in sitting and supine position were symmetrical in appearance, no palpable masses or tenderness,  no skin retraction, no nipple inversion, no nipple discharge, no skin discoloration, no axillary or supraclavicular lymphadenopathy Abdomen: no palpable masses or tenderness, no rebound or guarding Extremities: no edema or skin discoloration or tenderness  Pelvic:  Bartholin, Urethra, Skene Glands: Within normal limits             Vagina: No gross lesions or discharge  Cervix: No gross lesions or discharge  Uteruanteverted, normal size, shape and consistency, non-tender and mobile  Adnexa  Without masses or tenderness  Anus and perineum  normal   Rectovaginal  normal sphincter tone without palpated masses or tenderness             Hemoccult Cards provided   Assessment/plan:   50 y.o. female for annual exam Who will return to the office later in the week for the following screening blood work: Comprehensive metabolic panel, fasting lipid profile, TSH, CBC, and urinalysis. Patient will be referred to the urologist for refractory overactive bladder despite anticholinergic treatment and Kegel exercises. Pap smear not indicated this year. Patient was reminded that later this year which she has her mammogram to request a three-dimensional mammogram because report from last year indicated she has dense breasts. We discussed importance of monthly self breast examination. Patient declined flu vaccine. Patient was provided with fecal Hemoccult cards to submit to the office for testing. She will schedule for colonoscopy later this year.     Ok Edwards MD, 12:18 PM 02/01/2015

## 2015-02-02 LAB — URINALYSIS W MICROSCOPIC + REFLEX CULTURE
BILIRUBIN URINE: NEGATIVE
CRYSTALS: NONE SEEN [HPF]
Casts: NONE SEEN [LPF]
Glucose, UA: NEGATIVE
HGB URINE DIPSTICK: NEGATIVE
KETONES UR: NEGATIVE
Leukocytes, UA: NEGATIVE
Nitrite: NEGATIVE
PROTEIN: NEGATIVE
RBC / HPF: NONE SEEN RBC/HPF (ref <=2)
Specific Gravity, Urine: 1.016 (ref 1.001–1.035)
WBC UA: NONE SEEN WBC/HPF (ref <=5)
Yeast: NONE SEEN [HPF]
pH: 6.5 (ref 5.0–8.0)

## 2015-02-05 ENCOUNTER — Other Ambulatory Visit: Payer: Self-pay | Admitting: Gynecology

## 2015-02-05 LAB — URINE CULTURE

## 2015-02-05 MED ORDER — NITROFURANTOIN MONOHYD MACRO 100 MG PO CAPS: 100.0000 mg | ORAL_CAPSULE | Freq: Two times a day (BID) | ORAL | Status: DC

## 2015-02-06 ENCOUNTER — Other Ambulatory Visit: Payer: Commercial Managed Care - PPO

## 2015-02-06 LAB — CBC WITH DIFFERENTIAL/PLATELET
BASOS ABS: 0 10*3/uL (ref 0.0–0.1)
Basophils Relative: 0 % (ref 0–1)
EOS ABS: 0.1 10*3/uL (ref 0.0–0.7)
EOS PCT: 1 % (ref 0–5)
HEMATOCRIT: 42.7 % (ref 36.0–46.0)
Hemoglobin: 14.1 g/dL (ref 12.0–15.0)
LYMPHS ABS: 2.1 10*3/uL (ref 0.7–4.0)
Lymphocytes Relative: 42 % (ref 12–46)
MCH: 28.2 pg (ref 26.0–34.0)
MCHC: 33 g/dL (ref 30.0–36.0)
MCV: 85.4 fL (ref 78.0–100.0)
MONO ABS: 0.3 10*3/uL (ref 0.1–1.0)
MPV: 11.6 fL (ref 8.6–12.4)
Monocytes Relative: 6 % (ref 3–12)
Neutro Abs: 2.6 10*3/uL (ref 1.7–7.7)
Neutrophils Relative %: 51 % (ref 43–77)
PLATELETS: 283 10*3/uL (ref 150–400)
RBC: 5 MIL/uL (ref 3.87–5.11)
RDW: 14 % (ref 11.5–15.5)
WBC: 5 10*3/uL (ref 4.0–10.5)

## 2015-02-06 LAB — COMPREHENSIVE METABOLIC PANEL WITH GFR
ALT: 10 U/L (ref 6–29)
AST: 15 U/L (ref 10–35)
Albumin: 4.1 g/dL (ref 3.6–5.1)
Alkaline Phosphatase: 60 U/L (ref 33–130)
BUN: 15 mg/dL (ref 7–25)
CO2: 28 mmol/L (ref 20–31)
Calcium: 9.5 mg/dL (ref 8.6–10.4)
Chloride: 103 mmol/L (ref 98–110)
Creat: 0.88 mg/dL (ref 0.50–1.05)
Glucose, Bld: 96 mg/dL (ref 65–99)
Potassium: 5.4 mmol/L — ABNORMAL HIGH (ref 3.5–5.3)
Sodium: 139 mmol/L (ref 135–146)
Total Bilirubin: 0.9 mg/dL (ref 0.2–1.2)
Total Protein: 7 g/dL (ref 6.1–8.1)

## 2015-02-06 LAB — LIPID PANEL
Cholesterol: 180 mg/dL (ref 125–200)
HDL: 67 mg/dL (ref >=46)
LDL Cholesterol: 98 mg/dL (ref <130)
Total CHOL/HDL Ratio: 2.7 ratio (ref <=5.0)
Triglycerides: 73 mg/dL (ref <150)
VLDL: 15 mg/dL (ref <30)

## 2015-02-06 LAB — TSH: TSH: 1.804 u[IU]/mL (ref 0.350–4.500)

## 2015-02-08 NOTE — Telephone Encounter (Signed)
Appointment on 03/13/15 @ 10:00am Dr.Tannebaum claudia informed pt.

## 2015-03-14 ENCOUNTER — Other Ambulatory Visit: Payer: Self-pay | Admitting: Urology

## 2015-04-26 ENCOUNTER — Encounter (HOSPITAL_BASED_OUTPATIENT_CLINIC_OR_DEPARTMENT_OTHER): Payer: Self-pay | Admitting: *Deleted

## 2015-04-26 NOTE — Progress Notes (Addendum)
NPO AFTER MN.  ARRIVE AT 0715.  NEEDS HG.  SPOUSE / DAUGHTER  WILL INTERPRET FOR PT DOS.

## 2015-04-27 ENCOUNTER — Ambulatory Visit (HOSPITAL_BASED_OUTPATIENT_CLINIC_OR_DEPARTMENT_OTHER): Payer: Commercial Managed Care - PPO | Admitting: Anesthesiology

## 2015-04-27 ENCOUNTER — Encounter (HOSPITAL_BASED_OUTPATIENT_CLINIC_OR_DEPARTMENT_OTHER): Payer: Self-pay

## 2015-04-27 ENCOUNTER — Ambulatory Visit (HOSPITAL_BASED_OUTPATIENT_CLINIC_OR_DEPARTMENT_OTHER)
Admission: RE | Admit: 2015-04-27 | Discharge: 2015-04-27 | Disposition: A | Discharge status: Home / Self Care | Payer: Commercial Managed Care - PPO | Source: Ambulatory Visit | Attending: Urology | Admitting: Urology

## 2015-04-27 ENCOUNTER — Encounter (HOSPITAL_BASED_OUTPATIENT_CLINIC_OR_DEPARTMENT_OTHER)
Admission: RE | Disposition: A | Discharge status: Home / Self Care | Payer: Self-pay | Source: Ambulatory Visit | Attending: Urology

## 2015-04-27 DIAGNOSIS — N393 Stress incontinence (female) (male): Secondary | ICD-10-CM

## 2015-04-27 DIAGNOSIS — N3946 Mixed incontinence: Secondary | ICD-10-CM | POA: Insufficient documentation

## 2015-04-27 HISTORY — DX: Personal history of gestational diabetes: Z86.32

## 2015-04-27 HISTORY — DX: Presence of spectacles and contact lenses: Z97.3

## 2015-04-27 HISTORY — PX: PUBOVAGINAL SLING: SHX1035

## 2015-04-27 LAB — POCT HEMOGLOBIN-HEMACUE: HEMOGLOBIN: 14.4 g/dL (ref 12.0–15.0)

## 2015-04-27 SURGERY — CREATION, PUBOVAGINAL SLING
Anesthesia: General | Site: Vagina

## 2015-04-27 MED ORDER — ACETAMINOPHEN 10 MG/ML IV SOLN
INTRAVENOUS | Status: DC | PRN
  Administered 2015-04-27: 1000 mg via INTRAVENOUS

## 2015-04-27 MED ORDER — EPHEDRINE SULFATE 50 MG/ML IJ SOLN
INTRAMUSCULAR | Status: DC | PRN
  Administered 2015-04-27: 10 mg via INTRAVENOUS
  Administered 2015-04-27: 15 mg via INTRAVENOUS

## 2015-04-27 MED ORDER — LACTATED RINGERS IV SOLN
INTRAVENOUS | Status: DC
  Administered 2015-04-27 (×2): via INTRAVENOUS
  Filled 2015-04-27: qty 1000

## 2015-04-27 MED ORDER — CEFAZOLIN SODIUM-DEXTROSE 2-3 GM-% IV SOLR
INTRAVENOUS | Status: AC
  Filled 2015-04-27: qty 50

## 2015-04-27 MED ORDER — MIDAZOLAM HCL 5 MG/5ML IJ SOLN
INTRAMUSCULAR | Status: DC | PRN
  Administered 2015-04-27: 2 mg via INTRAVENOUS

## 2015-04-27 MED ORDER — PROPOFOL 10 MG/ML IV BOLUS
INTRAVENOUS | Status: DC | PRN
  Administered 2015-04-27: 200 mg via INTRAVENOUS

## 2015-04-27 MED ORDER — TRAMADOL-ACETAMINOPHEN 37.5-325 MG PO TABS: 1.0000 | ORAL_TABLET | Freq: Four times a day (QID) | ORAL | Status: DC | PRN

## 2015-04-27 MED ORDER — FENTANYL CITRATE (PF) 100 MCG/2ML IJ SOLN
INTRAMUSCULAR | Status: DC | PRN
  Administered 2015-04-27 (×2): 50 ug via INTRAVENOUS

## 2015-04-27 MED ORDER — MIDAZOLAM HCL 2 MG/2ML IJ SOLN
INTRAMUSCULAR | Status: AC
  Filled 2015-04-27: qty 2

## 2015-04-27 MED ORDER — EPHEDRINE SULFATE 50 MG/ML IJ SOLN
INTRAMUSCULAR | Status: AC
  Filled 2015-04-27: qty 1

## 2015-04-27 MED ORDER — DEXAMETHASONE SODIUM PHOSPHATE 10 MG/ML IJ SOLN
INTRAMUSCULAR | Status: AC
  Filled 2015-04-27: qty 1

## 2015-04-27 MED ORDER — BUPIVACAINE-EPINEPHRINE 0.5% -1:200000 IJ SOLN
INTRAMUSCULAR | Status: DC | PRN
  Administered 2015-04-27: 10 mL

## 2015-04-27 MED ORDER — LIDOCAINE HCL (CARDIAC) 20 MG/ML IV SOLN
INTRAVENOUS | Status: DC | PRN
  Administered 2015-04-27: 100 mg via INTRAVENOUS

## 2015-04-27 MED ORDER — DEXAMETHASONE SODIUM PHOSPHATE 4 MG/ML IJ SOLN
INTRAMUSCULAR | Status: DC | PRN
  Administered 2015-04-27: 10 mg via INTRAVENOUS

## 2015-04-27 MED ORDER — TRIMETHOPRIM 100 MG PO TABS: 200.0000 mg | ORAL_TABLET | Freq: Every day | ORAL | Status: DC

## 2015-04-27 MED ORDER — ACETAMINOPHEN 10 MG/ML IV SOLN
INTRAVENOUS | Status: AC
  Filled 2015-04-27: qty 100

## 2015-04-27 MED ORDER — FENTANYL CITRATE (PF) 100 MCG/2ML IJ SOLN
INTRAMUSCULAR | Status: AC
  Filled 2015-04-27: qty 2

## 2015-04-27 MED ORDER — LIDOCAINE HCL (CARDIAC) 20 MG/ML IV SOLN
INTRAVENOUS | Status: AC
  Filled 2015-04-27: qty 5

## 2015-04-27 MED ORDER — ONDANSETRON HCL 4 MG/2ML IJ SOLN
INTRAMUSCULAR | Status: AC
  Filled 2015-04-27: qty 2

## 2015-04-27 MED ORDER — FENTANYL CITRATE (PF) 100 MCG/2ML IJ SOLN
25.0000 ug | INTRAMUSCULAR | Status: DC | PRN
  Filled 2015-04-27: qty 1

## 2015-04-27 MED ORDER — FLUORESCEIN SODIUM 10 % IJ SOLN
INTRAMUSCULAR | Status: DC | PRN
  Administered 2015-04-27: 3 mL via INTRAVENOUS

## 2015-04-27 MED ORDER — DEXTROSE 5 % IV SOLN
2.0000 g | INTRAVENOUS | Status: AC
  Administered 2015-04-27: 2 g via INTRAVENOUS
  Filled 2015-04-27: qty 20

## 2015-04-27 MED ORDER — FLUORESCEIN SODIUM 10 % IJ SOLN
INTRAMUSCULAR | Status: AC
  Filled 2015-04-27: qty 5

## 2015-04-27 MED ORDER — PROPOFOL 10 MG/ML IV BOLUS
INTRAVENOUS | Status: AC
  Filled 2015-04-27: qty 20

## 2015-04-27 MED ORDER — LACTATED RINGERS IV SOLN
INTRAVENOUS | Status: DC
  Filled 2015-04-27: qty 1000

## 2015-04-27 MED ORDER — ONDANSETRON HCL 4 MG/2ML IJ SOLN
INTRAMUSCULAR | Status: DC | PRN
  Administered 2015-04-27: 4 mg via INTRAVENOUS

## 2015-04-27 MED ORDER — KETOROLAC TROMETHAMINE 30 MG/ML IJ SOLN
INTRAMUSCULAR | Status: AC
  Filled 2015-04-27: qty 1

## 2015-04-27 SURGICAL SUPPLY — 45 items
BAG URINE DRAINAGE (UROLOGICAL SUPPLIES) ×3 IMPLANT
BLADE CLIPPER SURG (BLADE) ×3 IMPLANT
BLADE SURG 10 STRL SS (BLADE) ×3 IMPLANT
BLADE SURG 15 STRL LF DISP TIS (BLADE) ×1 IMPLANT
BLADE SURG 15 STRL SS (BLADE) ×3
CANISTER SUCTION 2500CC (MISCELLANEOUS) ×6 IMPLANT
CATH FOLEY 2WAY SLVR  5CC 16FR (CATHETERS) ×2
CATH FOLEY 2WAY SLVR 5CC 16FR (CATHETERS) ×1 IMPLANT
COVER BACK TABLE 60X90IN (DRAPES) ×3 IMPLANT
COVER MAYO STAND STRL (DRAPES) ×3 IMPLANT
DISSECTOR ROUND CHERRY 3/8 STR (MISCELLANEOUS) IMPLANT
DRAPE UNDERBUTTOCKS STRL (DRAPE) ×3 IMPLANT
FLOSEAL 10ML (HEMOSTASIS) IMPLANT
GAUZE SPONGE 4X4 16PLY XRAY LF (GAUZE/BANDAGES/DRESSINGS) IMPLANT
GLOVE BIO SURGEON STRL SZ7.5 (GLOVE) ×3 IMPLANT
GOWN STRL REUS W/ TWL LRG LVL3 (GOWN DISPOSABLE) ×1 IMPLANT
GOWN STRL REUS W/ TWL XL LVL3 (GOWN DISPOSABLE) ×1 IMPLANT
GOWN STRL REUS W/TWL LRG LVL3 (GOWN DISPOSABLE) ×6
GOWN STRL REUS W/TWL XL LVL3 (GOWN DISPOSABLE) ×6
KIT ROOM TURNOVER WOR (KITS) ×3 IMPLANT
LIQUID BAND (GAUZE/BANDAGES/DRESSINGS) IMPLANT
NDL 1/2 CIR CATGUT .05X1.09 (NEEDLE) IMPLANT
NEEDLE 1/2 CIR CATGUT .05X1.09 (NEEDLE) IMPLANT
NEEDLE HYPO 22GX1.5 SAFETY (NEEDLE) ×6 IMPLANT
PACKING VAGINAL (PACKING) ×1 IMPLANT
PENCIL BUTTON HOLSTER BLD 10FT (ELECTRODE) ×3 IMPLANT
PLUG CATH AND CAP STER (CATHETERS) ×3 IMPLANT
RETRACTOR LONRSTAR 16.6X16.6CM (MISCELLANEOUS) ×1 IMPLANT
RETRACTOR STAY HOOK 5MM (MISCELLANEOUS) ×3 IMPLANT
RETRACTOR STER APS 16.6X16.6CM (MISCELLANEOUS) ×3
SET IRRIG Y TYPE TUR BLADDER L (SET/KITS/TRAYS/PACK) ×3 IMPLANT
SHEET LAVH (DRAPES) ×3 IMPLANT
SLING ALTIS SYSTEM (Sling) ×2 IMPLANT
SPONGE LAP 4X18 X RAY DECT (DISPOSABLE) ×3 IMPLANT
SUCTION FRAZIER HANDLE 10FR (MISCELLANEOUS) ×2
SUCTION TUBE FRAZIER 10FR DISP (MISCELLANEOUS) ×1 IMPLANT
SUT VIC AB 2-0 UR6 27 (SUTURE) ×4 IMPLANT
SYR BULB IRRIGATION 50ML (SYRINGE) ×3 IMPLANT
SYR CONTROL 10ML LL (SYRINGE) ×3 IMPLANT
SYRINGE 10CC LL (SYRINGE) ×3 IMPLANT
TRAY DSU PREP LF (CUSTOM PROCEDURE TRAY) ×3 IMPLANT
TUBE CONNECTING 12'X1/4 (SUCTIONS) ×2
TUBE CONNECTING 12X1/4 (SUCTIONS) ×4 IMPLANT
WATER STERILE IRR 500ML POUR (IV SOLUTION) ×6 IMPLANT
YANKAUER SUCT BULB TIP NO VENT (SUCTIONS) ×2 IMPLANT

## 2015-04-27 NOTE — Op Note (Signed)
Post-operative Diagnosis: Stress urinary incontinence   Procedure and Anesthesia:  Procedure(s) and Anesthesia Type:    * ALTIS COLOPLAST SLING, CYSTOSCOPY - General  Surgeon: Surgeon(s) and Role:    * Jethro BolusSigmund Krystalynn Ridgeway, MD - Primary   Resident:  Juliane PootPeter S Greene  EBL: 75 ml  IVF: See anesthesia record  Drains: none  Implants:  Implant Name Type Inv. Item Serial No. Manufacturer Lot No. LRB No. Used  SLING ALTIS SYSTEM - [id number] Sling SLING ALTIS SYSTEM   COLOPLAST [id number] N/A 1    Complications: * No complications entered in OR log *  Indications for Surgery: 50 y.o. female with stress urinary incontinence. Risks, benefits, and alternatives of the above procedure were discussed previously in detail and informed consents was signed and verified.  Findings: Demonstrated stress urinary incontinence with pressure on a full bladder. Cystoscopy revealed no lesions and fluorescein efflux from both UOs after placement of the sling.    Procedure Details: The patient and consent was verified in the pre-op holding area and brought to the operating room where they were placed on the operating table. Pre-induction time out was called and general anesthesia was induced. SCDs were placed and IV antibiotics were started.   Foley catheter was placed. The area of the bladder neck is marked. The mid urethra is marked. Injection was then accomplished with Marcaine 0.5% with epinephrine 1 200,000 in the mid urethral area.  The vaginal incision over the mid urethra is made with a 15 blade. Subcutaneous tissue is dissected, bilaterally. Dissection is carried out towards the pubic tubercle.    The Altis sling is secured to the passing trocar in standard fashion  The Irrigation of the vagina was accomplished, and the sling is smooth against the urethra, but not tight. At this point we performed cystoscopy to fill the bladder and check for efflux which was present bilaterally. We re-tensioned the sling to  lay flat and just until the incontinence was stopped.   Some bleeding is noted and so an inner 2-0 vicryl layer of submucosal tissue in a running fashion was done.. The vaginal incision is closed with a running 2-0 Vicryl suture. The foley catheter was removed and the patient left partially full to allow urination in the PACU. She was awakened and taken to recovery room in good condition.   Urology Attending Note: I was present for and participated in all aspects all aspects of this patient's surgical care.

## 2015-04-27 NOTE — H&P (Signed)
Reason For Visit [facility]   Active Problems Problems  1. Female stress incontinence (N39.3)  History of Present Illness     50 yo [place] female, P 6-5-1, referred by Dr. Lily Peer for further evaluation of mixed stress and urge incontinence. She is accompanied by her daughter, Annice Pih.   Mrs Schmaltz believes that he feels "something falling out" when she coughs, but doesn't know what it might be. She began to note SUI symptoms 15 yrs ago-and symptoms have worsened significantly over the last 4 years. She no longer rides a bicycle.     She also has some urge incontinence, and notes nocturnal leakage upon arising from her bed-or she notes urge incontinence. .Courtesy maneuver helps , but does not stop her stress component entirely. When she stops coughing, she stops leaking.  She has failed Kegel exercises per Dr. Lily Peer. She has also failed Detrol .    Urodynamics per Dr.Fernandex failed to show VLPP determination. .   No constipation. No tobacco. No family hx of incontinence.   Past Medical History Problems  1. History of esophageal reflux (Z87.19)  Surgical History Problems  1. History of No Surgical Problems  Current Meds 1. Multiple Vitamin TABS;  Therapy: (Recorded:21Feb2017) to Recorded  Allergies Medication  1. No Known Drug Allergies  Family History Problems  1. No pertinent family history  Social History Problems    Caffeine use (F15.90)   Married   Never a smoker   No alcohol use   Occupation  Review of Systems Genitourinary, constitutional, skin, eye, otolaryngeal, hematologic/lymphatic, cardiovascular, pulmonary, endocrine, musculoskeletal, gastrointestinal, neurological and psychiatric system(s) were reviewed and pertinent findings if present are noted and are otherwise negative.  Genitourinary: urinary frequency and incontinence.  Gastrointestinal: heartburn.    Vitals Vital Signs [Data Includes: Last 1 Day]  Recorded: 21Feb2017 10:45AM   Height: 5 ft 2 in Weight: 140 lb  BMI Calculated: 25.61 BSA Calculated: 1.64 Blood Pressure: 121 / 81 Heart Rate: 66  Physical Exam Constitutional: Well nourished and well developed . No acute distress.  ENT:. The ears and nose are normal in appearance.  Neck: The appearance of the neck is normal and no neck mass is present.  Pulmonary: No respiratory distress and normal respiratory rhythm and effort.  Cardiovascular: Heart rate and rhythm are normal . No peripheral edema.  Abdomen: The abdomen is rounded. The abdomen is soft and nontender. No masses are palpated. No CVA tenderness. No hernias are palpable. No hepatosplenomegaly noted.  Genitourinary:  Chaperone Present: kim lewis.  Examination of the external genitalia shows normal female external genitalia and no lesions. The urethra is normal in appearance, not tender and no urethral caruncle. There is no urethral mass. Urethral hypermobility is present. There is no urethral discharge. There is no urethral prolapse. Vaginal exam demonstrates no abnormalities, no atrophy, no tenderness, the vaginal epithelium to be well estrogenized and no uterine prolapse. No cystocele is identified. No enterocele is identified. No rectocele is identified. There is no cervical discharge. There is no cervical motion tenderness The uterus is without abnormalities and non tender. The adnexa are palpably normal. The bladder is non tender and not distended. The anus is normal on inspection. The perineum is normal on inspection.  Lymphatics: The femoral and inguinal nodes are not enlarged or tender.  Skin: Normal skin turgor, no visible rash and no visible skin lesions.  Neuro/Psych:. Mood and affect are appropriate.    Results/Data Urine [Data Includes: Last 1 Day]   21Feb2017  COLOR YELLOW  APPEARANCE CLEAR   SPECIFIC GRAVITY 1.010   pH 6.5   GLUCOSE NEGATIVE   BILIRUBIN NEGATIVE   KETONE NEGATIVE   BLOOD NEGATIVE   PROTEIN NEGATIVE   NITRITE  NEGATIVE   LEUKOCYTE ESTERASE NEGATIVE    Procedure Gaynell FaceMarshall test: PVR - 100cc and instilled 200cc of sterile water.     Assessment Assessed  1. Female stress incontinence (N39.3)  50 yo female, P6, with symptoms of stress urinary incontinence, which is 75% oif her symptom complex. She has failed Kagel exercises. She had Urodynamics, but no usable information is obtained. She has a hypermobile urethra, and, with her history, I think she is a good candidate for Altis urethral sling surgery with out additional bladder nerve testing. We have discussed the pathophysiology of stress urinary incontinence, and the rationale for surgery for her.    She will be out of work for 1 week, although the sutures will not dissolve for 3 weeks, and the wound will not be healed for 6 weeks. ( for sexual purposes). She may have 5-15% chance that urge component will go away with surgery , but she may need Rx post op for urge component.    Surgery has been explained to the pt, and the necessity to void prior to discharge. ( op surgery).   Plan Female stress incontinence  1. Affiliated Computer ServicesMarshall Test; Status:Complete;   Done: 21Feb2017  Schedule surgery for SUI.   Discussion/Summary cc: Reynaldo MiniumJuan Fernandez, MD     Signatures Electronically signed by : Jethro BolusSigmund Elka Satterfield, M.D.; Mar 13 2015 12:31PM EST

## 2015-04-27 NOTE — Discharge Instructions (Addendum)
Anterior or Posterior Colporrhaphy, Sling Procedure Care After Refer to this sheet in the next few weeks. These instructions provide you with information on caring for yourself after your procedure. Your caregiver may also give you specific instructions. Your treatment has been planned according to current medical practices, but problems sometimes occur. Call your caregiver if you have any problems or questions after your procedure. HOME CARE INSTRUCTIONS  Rest as much as possible the first 2 weeks.   Avoid heavy lifting (more than 10 pounds or 4.5 kg), pushing, or pulling. Limit stair climbing to once or twice a day the first week, then slowly increase this activity.   Avoid standing for prolonged periods of time.   Talk with your caregiver about when you may resume your usual physical activity.   You may resume your normal diet.   Limit fluids to 6 to 8 glasses of non-caffeinated beverages per day.   Eat a well-balanced diet. Daily portions of food from the meat (protein), milk, fruit, vegetable, and bread families are necessary for your health.   Your normal bowel function should return. If constipation should occur, you may:   Take a mild laxative.   Add fruit and bran to your diet.   Drink more liquids.   If additional bladder care is required, you will receive instructions from your caregiver. Call your caregiver if you experience burning, urgency, or frequency on urination or if you are unable to completely empty your bladder.   You may take a shower and wash your hair.   Only take over-the-counter or prescription medicines for pain, fever, or discomfort as directed by your caregiver.   Clean the incision with water. Do not use a dressing unless the incision is draining or irritated. Check your incision daily for redness, draining, swelling, or separation of the skin. Call your caregiver if you notice any of these.   Keep your perineal area (the area between vagina and  rectum) clean and dry. Perform perineal care after every bowel movement and each time you urinate. You may take a sitz bath or sit in a tub of clean, warm water when necessary, unless your caregiver tells you otherwise.   Do not have sexual intercourse until permitted by your caregiver.   It is still important to have a yearly pelvic exam.  SEEK MEDICAL CARE IF:  You have shaking chills.   You have an oral temperature above 102 F (38.9 C).   Your pain is not relieved or becomes severe.  MAKE SURE YOU:   Understand these instructions.   Will watch your condition.   Will get help right away if you are not doing well or get worse.  Document Released: 08/21/2003 Document Revised: 12/26/2010 Document Reviewed: 05/12/2007 [facility] Patient Information 2012 [place], [place].  1.Husband to go to [facility] to get post op appointment for next week with NP ( he is off work to care for wife). She is Spanish-speaking only.  2. No sexual activity for 4 weeks to allow sutures to dissolve ( will cause pain for both).  3. No heavy lifting ( > 20 lbs), no riding bicycles for 6 weeks 4. OK to drive next week.  Post Anesthesia Home Care Instructions  Activity: Get plenty of rest for the remainder of the day. A responsible adult should stay with you for 24 hours following the procedure.  For the next 24 hours, DO NOT: -Drive a car -Advertising copywriterperate machinery -Drink alcoholic beverages -Take any medication unless instructed by your  physician -Make any legal decisions or sign important papers.  Meals: Start with liquid foods such as gelatin or soup. Progress to regular foods as tolerated. Avoid greasy, spicy, heavy foods. If nausea and/or vomiting occur, drink only clear liquids until the nausea and/or vomiting subsides. Call your physician if vomiting continues.  Special Instructions/Symptoms: Your throat may feel dry or sore from the anesthesia or the breathing tube placed in your throat during  surgery. If this causes discomfort, gargle with warm salt water. The discomfort should disappear within 24 hours.  If you had a scopolamine patch placed behind your ear for the management of post- operative nausea and/or vomiting:  1. The medication in the patch is effective for 72 hours, after which it should be removed.  Wrap patch in a tissue and discard in the trash. Wash hands thoroughly with soap and water. 2. You may remove the patch earlier than 72 hours if you experience unpleasant side effects which may include dry mouth, dizziness or visual disturbances. 3. Avoid touching the patch. Wash your hands with soap and water after contact with the patch.

## 2015-04-27 NOTE — Anesthesia Preprocedure Evaluation (Addendum)
Anesthesia Evaluation  Patient identified by MRN, date of birth, ID band Patient awake    Reviewed: Allergy & Precautions, H&P , NPO status , Patient's Chart, lab work & pertinent test results  Airway Mallampati: II  TM Distance: >3 FB Neck ROM: full    Dental  (+) Dental Advisory Given, Missing Left upper front missing:   Pulmonary neg pulmonary ROS,    Pulmonary exam normal breath sounds clear to auscultation       Cardiovascular Exercise Tolerance: Good negative cardio ROS Normal cardiovascular exam Rhythm:regular Rate:Normal     Neuro/Psych negative neurological ROS  negative psych ROS   GI/Hepatic negative GI ROS, Neg liver ROS,   Endo/Other  negative endocrine ROS  Renal/GU negative Renal ROS  negative genitourinary   Musculoskeletal   Abdominal   Peds  Hematology negative hematology ROS (+)   Anesthesia Other Findings   Reproductive/Obstetrics negative OB ROS                            Anesthesia Physical Anesthesia Plan  ASA: II  Anesthesia Plan: General   Post-op Pain Management:    Induction: Intravenous  Airway Management Planned: LMA  Additional Equipment:   Intra-op Plan:   Post-operative Plan:   Informed Consent: I have reviewed the patients History and Physical, chart, labs and discussed the procedure including the risks, benefits and alternatives for the proposed anesthesia with the patient or authorized representative who has indicated his/her understanding and acceptance.   Dental Advisory Given  Plan Discussed with: CRNA and Surgeon  Anesthesia Plan Comments:         Anesthesia Quick Evaluation

## 2015-04-27 NOTE — Anesthesia Postprocedure Evaluation (Signed)
Anesthesia Post Note  Patient: Suzanne Harrington  Procedure(s) Performed: Procedure(s) (LRB): ALTIS COLOPLAST SLING, CYSTOSCOPY (N/A)  Patient location during evaluation: PACU Anesthesia Type: General Level of consciousness: awake and alert Pain management: pain level controlled Vital Signs Assessment: post-procedure vital signs reviewed and stable Respiratory status: spontaneous breathing, nonlabored ventilation, respiratory function stable and patient connected to nasal cannula oxygen Cardiovascular status: blood pressure returned to baseline and stable Postop Assessment: no signs of nausea or vomiting Anesthetic complications: no    Last Vitals:  Filed Vitals:   04/27/15 1045 04/27/15 1123  BP: 129/77 121/67  Pulse: 82 82  Temp:  36.1 C  Resp: 26 18    Last Pain:  Filed Vitals:   04/27/15 1136  PainSc: 0-No pain                 Tinlee Navarrette L

## 2015-04-27 NOTE — Anesthesia Procedure Notes (Signed)
Procedure Name: LMA Insertion Date/Time: 04/27/2015 8:58 AM Performed by: Norva PavlovALLAWAY, Leslieanne Cobarrubias G Pre-anesthesia Checklist: Patient identified, Emergency Drugs available, Suction available and Patient being monitored Patient Re-evaluated:Patient Re-evaluated prior to inductionOxygen Delivery Method: Circle System Utilized Preoxygenation: Pre-oxygenation with 100% oxygen Intubation Type: IV induction Ventilation: Mask ventilation without difficulty LMA: LMA inserted LMA Size: 4.0 Number of attempts: 1 Airway Equipment and Method: bite block Placement Confirmation: positive ETCO2 Tube secured with: Tape Dental Injury: Teeth and Oropharynx as per pre-operative assessment

## 2015-04-27 NOTE — Interval H&P Note (Signed)
History and Physical Interval Note:  04/27/2015 8:37 AM  Suzanne Harrington  has presented today for surgery, with the diagnosis of STRESS URINARY INCONTINENCE   The various methods of treatment have been discussed with the patient and family. After consideration of risks, benefits and other options for treatment, the patient has consented to  Procedure(s): ALTIS COLOPLAST SLING (N/A) as a surgical intervention .  The patient's history has been reviewed, patient examined, no change in status, stable for surgery.  I have reviewed the patient's chart and labs.  Questions were answered to the patient's satisfaction.     [name] I Jleigh Striplin

## 2015-04-27 NOTE — Transfer of Care (Signed)
Last Vitals:  Filed Vitals:   04/27/15 0720 04/27/15 1005  BP: 108/66   Pulse: 78 104  Temp: 36.9 C 36.5 C  Resp: 14 14   Immediate Anesthesia Transfer of Care Note  Patient: Suzanne Harrington  Procedure(s) Performed: Procedure(s) (LRB): ALTIS COLOPLAST SLING, CYSTOSCOPY (N/A)  Patient Location: PACU  Anesthesia Type: General  Level of Consciousness: awake, alert  and oriented  Airway & Oxygen Therapy: Patient Spontanous Breathing and Patient connected to nasal cannula oxygen  Post-op Assessment: Report given to PACU RN and Post -op Vital signs reviewed and stable  Post vital signs: Reviewed and stable  Complications: No apparent anesthesia complications

## 2015-04-30 ENCOUNTER — Encounter (HOSPITAL_BASED_OUTPATIENT_CLINIC_OR_DEPARTMENT_OTHER): Payer: Self-pay | Admitting: Urology

## 2016-02-15 ENCOUNTER — Other Ambulatory Visit: Payer: Self-pay | Admitting: Gynecology

## 2016-02-15 DIAGNOSIS — Z1231 Encounter for screening mammogram for malignant neoplasm of breast: Secondary | ICD-10-CM

## 2016-03-11 ENCOUNTER — Encounter: Payer: Self-pay | Admitting: Gynecology

## 2016-03-11 ENCOUNTER — Ambulatory Visit (INDEPENDENT_AMBULATORY_CARE_PROVIDER_SITE_OTHER): Payer: Commercial Managed Care - PPO | Admitting: Gynecology

## 2016-03-11 VITALS — BP 120/76 | Ht 63.0 in | Wt 139.0 lb

## 2016-03-11 DIAGNOSIS — Z01419 Encounter for gynecological examination (general) (routine) without abnormal findings: Secondary | ICD-10-CM

## 2016-03-11 LAB — URINALYSIS W MICROSCOPIC + REFLEX CULTURE
Bilirubin Urine: NEGATIVE
CASTS: NONE SEEN [LPF]
Crystals: NONE SEEN [HPF]
Glucose, UA: NEGATIVE
HGB URINE DIPSTICK: NEGATIVE
KETONES UR: NEGATIVE
NITRITE: NEGATIVE
PH: 7 (ref 5.0–8.0)
PROTEIN: NEGATIVE
RBC / HPF: NONE SEEN RBC/HPF (ref <=2)
Specific Gravity, Urine: 1.017 (ref 1.001–1.035)
YEAST: NONE SEEN [HPF]

## 2016-03-11 LAB — COMPREHENSIVE METABOLIC PANEL
ALBUMIN: 4.1 g/dL (ref 3.6–5.1)
ALK PHOS: 91 U/L (ref 33–130)
ALT: 17 U/L (ref 6–29)
AST: 19 U/L (ref 10–35)
BUN: 17 mg/dL (ref 7–25)
CHLORIDE: 105 mmol/L (ref 98–110)
CO2: 30 mmol/L (ref 20–31)
CREATININE: 0.82 mg/dL (ref 0.50–1.05)
Calcium: 9.5 mg/dL (ref 8.6–10.4)
Glucose, Bld: 90 mg/dL (ref 65–99)
POTASSIUM: 4.1 mmol/L (ref 3.5–5.3)
Sodium: 141 mmol/L (ref 135–146)
TOTAL PROTEIN: 7.1 g/dL (ref 6.1–8.1)
Total Bilirubin: 0.9 mg/dL (ref 0.2–1.2)

## 2016-03-11 LAB — CBC WITH DIFFERENTIAL/PLATELET
BASOS ABS: 51 {cells}/uL (ref 0–200)
BASOS PCT: 1 %
EOS ABS: 102 {cells}/uL (ref 15–500)
Eosinophils Relative: 2 %
HCT: 40.3 % (ref 35.0–45.0)
HEMOGLOBIN: 13.4 g/dL (ref 11.7–15.5)
LYMPHS ABS: 2346 {cells}/uL (ref 850–3900)
Lymphocytes Relative: 46 %
MCH: 27.9 pg (ref 27.0–33.0)
MCHC: 33.3 g/dL (ref 32.0–36.0)
MCV: 83.8 fL (ref 80.0–100.0)
MONO ABS: 357 {cells}/uL (ref 200–950)
MONOS PCT: 7 %
MPV: 11.1 fL (ref 7.5–12.5)
NEUTROS ABS: 2244 {cells}/uL (ref 1500–7800)
Neutrophils Relative %: 44 %
PLATELETS: 229 10*3/uL (ref 140–400)
RBC: 4.81 MIL/uL (ref 3.80–5.10)
RDW: 14.1 % (ref 11.0–15.0)
WBC: 5.1 10*3/uL (ref 3.8–10.8)

## 2016-03-11 LAB — LIPID PANEL
CHOL/HDL RATIO: 2.6 ratio (ref <5.0)
CHOLESTEROL: 182 mg/dL (ref <200)
HDL: 71 mg/dL (ref >50)
LDL Cholesterol: 98 mg/dL (ref <100)
TRIGLYCERIDES: 65 mg/dL (ref <150)
VLDL: 13 mg/dL (ref <30)

## 2016-03-11 LAB — TSH: TSH: 1.56 mIU/L

## 2016-03-11 NOTE — Addendum Note (Signed)
Addended by: Dayna BarkerGARDNER, Emilygrace Grothe K on: 03/11/2016 09:11 AM   Modules accepted: Orders

## 2016-03-11 NOTE — Patient Instructions (Signed)
Menopausia y terapia de reemplazo hormonal (Menopause and Hormone Replacement Therapy) QU ES LA TERAPIA DE REEMPLAZO HORMONAL? La terapia de reemplazo hormonal (TRH) es el uso de hormonas artificiales (sintticas) para Training and development officer las hormonas que el cuerpo deja de producir durante la menopausia. La menopausia es la etapa normal de la vida cuando los perodos [employer] desaparecen y los ovarios dejan de producir hormonas femeninas, estrgenos y Immunologist. Esta falta de hormonas puede afectar la salud y causar sntomas indeseables. La TRH puede aliviar algunos de esos sntomas. CULES SON MIS OPCIONES PARA UNA TRH? La TRH puede consistir en hormonas sintticas de Oceanographer y Immunologist o puede consistir solamente en estrgeno (terapia de estrgenos solamente). Usted y su mdico decidirn qu tipo de TRH es la ms [place] para usted. Si elige Lucilla Edin TRH y tiene tero, generalmente se prescribe estrgeno y progesterona. La terapia de estrgenos solamente se [place] para las mujeres que no tienen tero. Las siguientes son algunas de las opciones para una TRH:  Pldoras.  Parches.  Geles.  Aerosoles.  Cremas vaginales.  Anillo vaginal.  Dispositivos vaginales. La cantidad de hormonas que tome y Physiological scientist en que deba tomarlas depender de su salud en particular. Es importante:  Comenzar una TRH con la dosificacin ms baja posible.  Interrumpa la TRH tan pronto como su mdico se lo indique.  Colabore con su mdico para estar bien informado y sentirse cmodo con sus decisiones. CULES SON LOS BENEFICIOS DE LA TRH? La TRH puede reducir la frecuencia y la gravedad de los sntomas de la menopausia. Los beneficios de la TRH pueden variar segn los sntomas de la menopausia que tenga, la gravedad de estos y su salud general. La TRH puede ayudarle a mejorar los siguientes sntomas de la menopausia:  Sofocones y sudores nocturnos. Estos consisten en una sensacin de calor que se  extiende por el rostro y el cuerpo. La piel se puede volver roja, como ruborizada. Los sudores nocturnos son oleadas de calor que ocurren al estar dormida o tratando de dormir.  Prdida de masa sea (osteoporosis). El organismo pierde calcio ms rpido despus de la menopausia, lo que debilita a los Jud. Esto puede aumentar el riesgo de huesos rotos (fracturas).  Sequedad vaginal. La membrana de la vagina se hace ms delgada y seca lo que puede causar dolor durante las relaciones sexuales o causar infecciones, ardor o picazn.  Infecciones en las vas urinarias.  Incontinencia urinaria. Esta es la disminucin de la capacidad de Chief Technology Officer la [place].  Irritabilidad.  Problemas de memoria de [employer]. CULES SON LOS RIESGOS DE LA TRH? Los riesgos de la TRH pueden variar segn su salud y su historia clnica. Los riesgos de la TRH tambin dependen de si usted recibe estrgeno y progesterona o solamente estrgeno.La TRH puede aumentar el riesgo de:  Tener prdidas. Estas ocurren cuando la vagina pierde repentinamente una pequea cantidad de [place].  Cncer de endometrio. Este cncer ocurre en la membrana del tero (endometrio).  Cncer de mama.  Aumenta la densidad del tejido de las [place]. Esto puede dificultar la deteccin de cncer de mamas con una radiografa de mamas (mamografa).  Ictus.  Infarto de miocardio.  Cogulos sanguneos.  Enfermedad de la vescula biliar. Los riesgos de la TRH pueden ser [employer] si tiene alguna de las siguientes afecciones:  Hotel manager de endometrio.  Enfermedad heptica.  Cardiopata.  Cncer de mama.  Antecedentes de cogulos sanguneos.  Antecedentes de ictus. CMO DEBO CUIDARME CUANDO ESTOY REALIZANDO UNA TRH?  [employer]  medicamentos de venta libre y los recetados solamente como se lo haya indicado el mdico.  Hgase mamografas, exmenes plvicos y chequeos con la frecuencia que le indique el mdico.  Hgase pruebas de Papanicolu  con la frecuencia que le indique el mdico. A esta prueba tambin se la denomina "Pap". Es una prueba de deteccin que se utiliza para detectar signos de cncer en el cuello del tero y la vagina. La prueba de Papanicolu tambin puede identificar la presencia de infeccin o cambios precancerosos. Las pruebas de Papanicolaou se pueden realizar:  Cada 3 aos, a partir de los 21 aos.  Cada 5 aos, a partir de los 30 aos, en combinacin con las pruebas que se realizan para detectar la presencia del virus del papiloma humano (VPH).  Con mayor o menor frecuencia segn las afecciones mdicas que tenga, su edad y otros factores de riesgo.  Es su responsabilidad retirar el resultado de la prueba de Papanicolu. Consulte a su mdico o en el departamento donde se realice el procedimiento cundo estarn listos los resultados.  Concurra a todas las visitas de control como se lo haya indicado el mdico. Esto es importante. CUNDO DEBO BUSCAR ATENCIN MDICA? Hable con el mdico en los siguientes casos:  Tiene alguno de estos sntomas:  Dolor o hinchazn en las piernas.  Falta de aire.  Dolor en el pecho.  Bultos o cambios en las mamas o en las axilas.  Hablar arrastrando las palabras.  Dolor, ardor o sangrado al orinar.  Tiene alguno de estos sntomas:  Hemorragia vaginal inusual.  Mareos o dolores de cabeza.  Adormecimiento o debilidad en cualquier parte de los brazos o de las piernas.  Dolor en el abdomen. Esta informacin no tiene como fin reemplazar el consejo del mdico. Asegrese de hacerle al mdico cualquier pregunta que tenga. Document Released: 06/25/2007 Document Revised: 04/30/2015 Document Reviewed: 07/10/2014 Elsevier Interactive Patient Education  2017 Elsevier Inc. La menopausia (Menopause) La menopausia es el perodo normal de la vida en el cual los perodos menstruales cesan por completo. Se completa cuando no se tienen 12 perodos menstruales consecutivos. Ocurre  generalmente en mujeres entre los 40 y los 55 aos. Muy rara vez una mujer tiene la menopausia antes de los 40 aos. En la menopausia, los ovarios dejan de producir las hormonas femeninas estrgeno y progesterona. Esto puede causar sntomas indeseables y tambin afectar a la salud. A veces los sntomas aparecen entre 4 y 5 aos antes de que comience la menopausia. No existe una relacin entre la menopausia y:  Los anticonceptivos orales.  La cantidad de hijos que tuvo.  La raza.  La edad en que comenzaron los perodos menstruales (menarca). Las mujeres que fuman mucho y las que son muy delgadas pueden desarrollar la menopausia a una edad ms temprana. CAUSAS  Los ovarios dejan de producir las hormonas femeninas estrgeno y progesterona.  Otras causas son:  Ciruga en la que se extirpan ambos ovarios.  Los ovarios que dejan de funcionar sin un motivo conocido.  Tumores de la glndula pituitaria en el cerebro.  Enfermedades que afectan los ovarios y la produccin de hormonas.  Radioterapia en el abdomen o la pelvis.  Quimioterapia que afecte a los ovarios. SNTOMAS  Acaloramiento.  Sudoracin nocturna.  Disminucin del deseo sexual.  Sequedad vaginal y adelgazamiento de la vagina, lo que causa relaciones sexuales dolorosas.  Sequedad de la piel y aparicin de arrugas.  Dolores de cabeza.  Cansancio.  Irritabilidad.  Problemas de memoria.  Aumento de peso.    Infeccin de la vejiga.  Aparicin de vello en el rostro y Loganel pecho.  Infertilidad. Los sntomas ms graves pueden incluir:  Prdida de masa sea osteoporosis) que favorece la rotura de huesos(fracturas).  Depresin.  Endurecimiento y Scientist, research (medical)estrechamiento de las arterias (aterosclerosis) lo que puede causar infarto e ictus. DIAGNSTICO  El perodo menstrual no aparece por 12 meses seguidos.  Examen fsico.  Estudios hormonales de Risk managerla sangre. TRATAMIENTO Hay muchas opciones de tratamiento y casi tantas  dudas como opciones existen. La decisin de tratar o no los cambios que trae la menopausia es una decisin que realiza el profesional de acuerdo con cada Marketing executivepersona en particular. El Animal nutritionistprofesional comentar las opciones de tratamiento con usted. Juntos podrn decidir qu tratamiento es el mejor para usted. Las opciones de tratamiento pueden incluir:  Terapia hormonal (estrgenos y Education officer, museumprogesterona).  Medicamentos no hormonales.  Tratamiento de los sntomas individuales con medicamentos (por ejemplo antidepresivos para la depresin).  Algunos medicamentos herbales pueden ayudar en sntomas especficos.  Psicoterapia con un psiclogo o psiquiatra.  Terapia grupal.  Cambios en el estilo de vida, como:  Consumir una dieta saludable.  Actividad fsica regular.  Limitar el consumo de cafena y alcohol.  Control del estrs y Landscape architectmeditacin.  No realizar tratamiento. INSTRUCCIONES PARA EL CUIDADO EN EL HOGAR  Tome todos los medicamentos como el mdico le indique.  Descanse y duerma lo suficiente.  Haga ejercicios regularmente.  Consuma una dieta que contenga calcio (bueno para los Erhardhuesos) y productos derivados de la soja (actan como estrgenos).  Evite las bebidas alcohlicas.  No fume.  Si tiene sofocones, vstase en capas.  Tome suplementos de calcio y vitamina D para fortalecer los [place].  Puede utilizar lubricantes y humectantes de venta libre para la sequedad vaginal.  En algunos casos la terapia de grupo podr ayudarla.  La acupuntura puede ser de ayuda en ciertos casos. SOLICITE ATENCIN MDICA SI:  No est segura de estar en la menopausia.  Tiene sntomas de Rwandamenopausia y Angolanecesita asesoramiento y TEFL teachertratamiento.  Tiene [age] aos y an tiene Environmental managerperodos menstruales.  Tiene dolor durante las The St. Paul Travelersrelaciones sexuales.  La menopausia se ha completado (no ha tenido el perodo menstrual durante 12 meses) y tiene un sangrado vaginal.  Necesita ser derivada a un especialista (gineclogo,  psiquiatra, o psiclogo) para Pensions consultantrealizar un tratamiento. SOLICITE ATENCIN MDICA DE INMEDIATO SI:  Siente una depresin intensa e incontrolable.  Tiene una hemorragia vaginal abundante.  Siente y cree que se ha fracturado un hueso.  Siente dolor al ConocoPhillipsorinar.  Siente dolor en el pecho o en la pierna.  Tiene latidos cardacos rpidos (palpitaciones).  Siente dolor de cabeza intenso.  Tiene problemas de visin.  Siente un bulto en la mama.  Siente un dolor abdominal intenso o indigestin. Esta informacin no tiene Theme park managercomo fin reemplazar el consejo del mdico. Asegrese de hacerle al mdico cualquier pregunta que tenga. Document Released: 02/17/2006 Document Revised: 09/08/2012 Document Reviewed: 08/05/2012 Elsevier Interactive Patient Education  2017 Elsevier Inc. Influenza Virus Vaccine (Flucelvax) Ladell HeadsQu es este medicamento? La VACUNA ANTIGRIPAL ayuda a disminuir el riesgo de contraer la influenza, tambin conocida como la gripe. La vacuna solo ayuda a protegerle contra algunas cepas de influenza. MARCAS COMUNES: FLUCELVAX Qu le debo informar a mi profesional de la salud antes de tomar este medicamento? Necesita saber si usted presenta alguno de los Coventry Health Caresiguientes problemas o situaciones: -trastorno de sangrado como hemofilia -fiebre o infeccin -sndrome de Guillain-Barre u otros problemas neurolgicos -problemas del sistema inmunolgico -infeccin por el virus de la  inmunodeficiencia humana (VIH) o SIDA -niveles bajos de plaquetas en la sangre -esclerosis mltiple -una reaccin alrgica o inusual a las vacunas antigripales, a otros medicamentos, alimentos, colorantes o conservantes -si est embarazada o buscando quedar embarazada -si est amamantando a un beb Cmo debo utilizar este medicamento? Esta vacuna se administra mediante inyeccin por va intramuscular. Lo administra un profesional de [employer]. Recibir una copia de informacin escrita sobre la vacuna antes de cada  vacuna. Asegrese de leer este folleto cada vez cuidadosamente. Este folleto puede cambiar con frecuencia. Hable con su pediatra para informarse acerca del uso de este medicamento en nios. Puede requerir atencin especial. Qu sucede si me Azucena Fallen dosis? No se aplica en este caso. Qu puede interactuar con este medicamento? -quimioterapia o radioterapia -medicamentos que suprimen el sistema inmunolgico, tales como etanercept, anakinra, infliximab y adalimumab -medicamentos que tratan o previenen cogulos sanguneos, como warfarina -fenitona -medicamentos esteroideos, como la prednisona o la cortisona -teofilina -vacunas A qu debo estar atento al usar [employer]? Informe a su mdico o a Producer, television/film/video de la [employer] todos los efectos secundarios que persistan despus de [address]. Llame a su proveedor de atencin mdica si se presentan sntomas inusuales dentro de las 6 semanas de recibir esta vacuna. Es posible que todava pueda contraer la gripe, pero la enfermedad no ser tan fuerte como normalmente. No puede contraer la gripe de esta vacuna. La vacuna antigripal no le protege contra resfros u otras enfermedades que pueden causar [place]. Debe vacunarse cada ao. Qu efectos secundarios puedo tener al [employer] este medicamento? Efectos secundarios que debe informar a su mdico o a Producer, television/film/video de la salud tan pronto como sea posible: -Therapist, art como erupcin cutnea, picazn o urticarias, hinchazn de la cara, labios o lengua Efectos secundarios que, por lo general, no requieren atencin mdica (debe informarlos a su mdico o a su profesional de la salud si persisten o si son molestos): -fiebre -dolor de cabeza -molestias y dolores musculares -dolor, sensibilidad, enrojecimiento o hinchazn en el lugar de la inyeccin -cansancio Dnde debo guardar mi medicina? Esta vacuna se administrar por un profesional de la salud en una [place], Corporate investment banker, consultorio  mdico u otro consultorio de un profesional de la salud. No se le suministrar esta vacuna para guardar en su domicilio.  2017 Elsevier/Gold Standard (2010-12-23 16:29:16)

## 2016-03-11 NOTE — Progress Notes (Signed)
Suzanne Harrington 09-25-1965 [id number]   History:    51 y.o.  for annual gyn exam with no complaints today. She is overdue for her mammogram and is scheduled for tomorrow. Patient has been menopausal for over 2 years and she denies any vasomotor symptoms. Patient still has not had her colonoscopy. Patient with no past history of abnormal Pap smears. Patient has not had the flu vaccine yet.  Past medical history,surgical history, family history and social history were all reviewed and documented in the EPIC chart.  Gynecologic History Patient's last menstrual period was 01/31/2014. Contraception: post menopausal status Last Pap: 2015. Results were: normal Last mammogram: 2016. Results were: normal  Obstetric History OB History  Gravida Para Term Preterm AB Living  6 5 5   1 5   SAB TAB Ectopic Multiple Live Births  1       5    # Outcome Date GA Lbr Len/2nd Weight Sex Delivery Anes PTL Lv  6 SAB           5 Term     M Vag-Spont  N LIV  4 Term     F Vag-Spont  N LIV  3 Term     F Vag-Spont  N LIV  2 Term     F Vag-Spont  N LIV  1 Term     F Vag-Spont  N LIV       ROS: A ROS was performed and pertinent positives and negatives are included in the history.  GENERAL: No fevers or chills. HEENT: No change in vision, no earache, sore throat or sinus congestion. NECK: No pain or stiffness. CARDIOVASCULAR: No chest pain or pressure. No palpitations. PULMONARY: No shortness of breath, cough or wheeze. GASTROINTESTINAL: No abdominal pain, nausea, vomiting or diarrhea, melena or bright red blood per rectum. GENITOURINARY: No urinary frequency, urgency, hesitancy or dysuria. MUSCULOSKELETAL: No joint or muscle pain, no back pain, no recent trauma. DERMATOLOGIC: No rash, no itching, no lesions. ENDOCRINE: No polyuria, polydipsia, no heat or cold intolerance. No recent change in weight. HEMATOLOGICAL: No anemia or easy bruising or bleeding. NEUROLOGIC: No headache, seizures, numbness, tingling or  weakness. PSYCHIATRIC: No depression, no loss of interest in normal activity or change in sleep pattern.     Exam: chaperone present  BP 120/76   Ht 5\' 3"  (1.6 m)   Wt 139 lb (63 kg)   LMP 01/31/2014   BMI 24.62 kg/m   Body mass index is 24.62 kg/m.  General appearance : Well developed well nourished female. No acute distress HEENT: Eyes: no retinal hemorrhage or exudates,  Neck supple, trachea midline, no carotid bruits, no thyroidmegaly Lungs: Clear to auscultation, no rhonchi or wheezes, or rib retractions  Heart: Regular rate and rhythm, no murmurs or gallops Breast:Examined in sitting and supine position were symmetrical in appearance, no palpable masses or tenderness,  no skin retraction, no nipple inversion, no nipple discharge, no skin discoloration, no axillary or supraclavicular lymphadenopathy Abdomen: no palpable masses or tenderness, no rebound or guarding Extremities: no edema or skin discoloration or tenderness  Pelvic:  Bartholin, Urethra, Skene Glands: Within normal limits             Vagina: No gross lesions or discharge  Cervix: No gross lesions or discharge  Uterus  anteverted, normal size, shape and consistency, non-tender and mobile  Adnexa  Without masses or tenderness  Anus and perineum  normal   Rectovaginal  normal sphincter tone without palpated masses or  tenderness             Hemoccult colonoscopy this year     Assessment/Plan:  51 y.o. female for annual exam will have the following fasting screening blood work: Comprehensive metabolic panel, fasting lipid profile, TSH, CBC, and urinalysis. Pap smear done today. Patient will go to her local pharmacy to get the flu vaccine since we ran out in our office. She was reminded that when she has her mammogram tomorrow to request for a three-dimensional mammogram as a result of her dense breasts. We discussed importance of monthly breast exams as well. I provided her with names of community gastroenterologist  for her to schedule her overdue screening colonoscopy this year.   Ok Edwards MD, 9:03 AM 03/11/2016

## 2016-03-12 ENCOUNTER — Ambulatory Visit
Admission: RE | Admit: 2016-03-12 | Discharge: 2016-03-12 | Disposition: A | Discharge status: Home / Self Care | Payer: Commercial Managed Care - PPO | Source: Ambulatory Visit | Attending: Gynecology | Admitting: Gynecology

## 2016-03-12 DIAGNOSIS — Z1231 Encounter for screening mammogram for malignant neoplasm of breast: Secondary | ICD-10-CM

## 2016-03-12 LAB — PAP IG W/ RFLX HPV ASCU

## 2016-03-12 LAB — URINE CULTURE

## 2016-03-14 LAB — HUMAN PAPILLOMAVIRUS, HIGH RISK: HPV DNA High Risk: NOT DETECTED

## 2016-06-04 ENCOUNTER — Encounter: Payer: Self-pay | Admitting: Gynecology

## 2017-03-12 ENCOUNTER — Encounter: Payer: Self-pay | Admitting: Obstetrics & Gynecology

## 2017-03-12 ENCOUNTER — Ambulatory Visit: Payer: Commercial Managed Care - PPO | Admitting: Obstetrics & Gynecology

## 2017-03-12 VITALS — BP 132/82 | Temp 100.8°F

## 2017-03-12 DIAGNOSIS — N3080 Other cystitis without hematuria: Secondary | ICD-10-CM

## 2017-03-12 DIAGNOSIS — R8290 Unspecified abnormal findings in urine: Secondary | ICD-10-CM

## 2017-03-12 MED ORDER — SULFAMETHOXAZOLE-TRIMETHOPRIM 800-160 MG PO TABS: 1.0000 | ORAL_TABLET | Freq: Two times a day (BID) | ORAL | 0 refills | Status: AC

## 2017-03-12 NOTE — Progress Notes (Signed)
    Suzanne Harrington 07-Aug-1965 [id number]        52 y.o.  [id number]   RP: Bad odor in urine  HPI: Patient reports a urinary tract infection treated at work 2 months ago.  Recently has developed an odor in her urine and her urine is looking darker and at times red.  Patient reports chills last night.  No pain with urination and no frequency.   OB History  Gravida Para Term Preterm AB Living  6 5 5   1 5   SAB TAB Ectopic Multiple Live Births  1       5    # Outcome Date GA Lbr Len/2nd Weight Sex Delivery Anes PTL Lv  6 SAB           5 Term     M Vag-Spont  N LIV  4 Term     F Vag-Spont  N LIV  3 Term     F Vag-Spont  N LIV  2 Term     F Vag-Spont  N LIV  1 Term     F Vag-Spont  N LIV      Past medical history,surgical history, problem list, medications, allergies, family history and social history were all reviewed and documented in the EPIC chart.   Directed ROS with pertinent positives and negatives documented in the history of present illness/assessment and plan.  Exam:  Vitals:   03/12/17 1219  BP: 132/82  Temp: (!) 100.8 F (38.2 C)  TempSrc: Oral   General appearance:  Normal  CVAT negative bilaterally  Tender in suprapubic area  U/A: Cloudy, white blood cells 20-40, red blood cells 3-10, bacteria many, nitrites positive.  Urine culture pending.   Assessment/Plan:  52 y.o. [id number]   1. Bad odor of urine Cystitis per urine analysis.  No evidence of acute pyelonephritis.  Pending urine culture.  Start on Bactrim DS 1 tablet per mouth twice a day for 3 days.  Usage reviewed.  Prescription sent to pharmacy.  Will call back to be reevaluated if no improvement after 24 hours.  Push PO water intake.   Patient will follow up soon for annual gynecologic exam.  Other orders - sulfamethoxazole-trimethoprim (BACTRIM DS,SEPTRA DS) 800-160 MG tablet; Take 1 tablet by mouth 2 (two) times daily for 3 days.  Counseling on above issues more than 50% for 15 minutes.  Genia DelMarie-Lyne  Jaquavis Felmlee MD, 12:37 PM 03/12/2017

## 2017-03-12 NOTE — Addendum Note (Signed)
Addended by: Becky SaxPARKER, ROBBIN M on: 03/12/2017 12:58 PM   Modules accepted: Orders

## 2017-03-12 NOTE — Patient Instructions (Addendum)
1. Bad odor of urine Cystitis per urine analysis.  No evidence of acute pyelonephritis.  Pending urine culture.  Start on Bactrim DS 1 tablet per mouth twice a day for 3 days.  Usage reviewed.  Prescription sent to pharmacy.  Call back for reevaluation if no improvement after hours.  Push PO water intake.  Patient will follow up soon for annual gynecologic exam.    Other orders - sulfamethoxazole-trimethoprim (BACTRIM DS,SEPTRA DS) 800-160 MG tablet; Take 1 tablet by mouth 2 (two) times daily for 3 days.  Mykal, fue un placer encontrarle hoy!  Voy a informarle de sus CDW Corporationresultados muy pronto.   Infeccin de las vas urinarias, en adultos Urinary Tract Infection, Adult Una infeccin de las vas urinarias (IVU) es una infeccin en cualquier parte de las vas urinarias, que incluyen los riones, los urteres, la vejiga y Engineer, miningla uretra. Estos rganos fabrican, Barrister's clerkalmacenan y eliminan la orina del organismo. La IVU puede ser una infeccin de la vejiga (cistitis) o una infeccin renal (pielonefritis). Cules son las causas? Esta infeccin puede deberse a hongos, virus o bacterias. Las bacterias son la causa ms comunes de las IVU. Esta afeccin tambin puede ser provocada por no vaciar la vejiga por completo durante la miccin en repetidas ocasiones. Qu incrementa el riesgo? Es ms probable que esta afeccin se manifieste si:  Usted ignora la necesidad de Geographical information systems officerorinar o retiene la orina durante mucho tiempo.  No vaca la vejiga completamente durante la miccin.  Es Janne Napoleonuna mujer y se limpia de atrs hacia adelante despus de Geographical information systems officerorinar o Advertising copywriterdefecar.  Es un hombre y est circuncidado.  Tiene estreimiento.  Tiene colocado un catter urinario (sonda urinaria) Wisemanpermanente.  Tiene debilitado el sistema de defensa (inmunitario) del cuerpo.  Tiene una enfermedad que Colgate Palmoliveafecta los intestinos, los riones o la vejiga.  Tiene diabetes.  Toma antibiticos con frecuencia o durante largos perodos, y los antibiticos ya no  resultan eficaces para combatir algunos tipos de infecciones (resistencia a los antibiticos).  Toma medicamentos que Lubrizol Corporationle irritan las vas Dixonurinarias.  Est expuesto a sustancias qumicas que le irritan las vas urinarias.  Es mujer.  Cules son los signos o los sntomas? Los sntomas de esta afeccin incluyen lo siguiente:  Grant RutsFiebre.  Miccin frecuente o eliminacin de pequeas cantidades de orina con frecuencia.  Necesidad urgente de Geographical information systems officerorinar.  Ardor o dolor al ConocoPhillipsorinar.  Orina con mal olor u olor atpico.  Mason Jimrina turbia.  Dolor en la parte baja del abdomen o en la espalda.  Dificultad para orinar.  Presencia de Federated Department Storessangre en la orina.  Tener vmitos o menos apetito de lo normal.  Diarrea o dolor abdominal.  Secrecin vaginal, si es mujer.  Cmo se diagnostica? Esta afeccin se diagnostica con base en la historia clnica y un examen fsico. Tambin deber proporcionar Lauris Poaguna muestra de orina para realizar anlisis. Podrn indicarle otros estudios, por ejemplo:  Anlisis de [place].  Anlisis de enfermedades de transmisin sexual (ETS).  Si ha tenido ms de una IVU, se pueden hacer estudios de diagnstico por imgenes o una cistoscopia para determinar la causa de las infecciones. Cmo se trata? El tratamiento de esta afeccin suele incluir una combinacin de dos o ms de los siguientes:  Antibiticos.  Otros medicamentos para tratar causas menos frecuentes de IVU.  Medicamentos de venta libre para Engineer, materialsaliviar el dolor.  Cantidad suficiente agua para mantenerse hidratado.  Siga estas indicaciones en su casa:  Tome los medicamentos de venta libre y los recetados solamente como se lo haya  indicado el mdico.  Si le recetaron un antibitico, tmelo como se lo haya indicado el mdico. No deje de tomar el antibitico aunque comience a sentirse mejor.  Evite el alcohol, la cafena, el t y las [address] Estas bebidas pueden irritar la vejiga.  Beba suficiente lquido  para Photographer orina clara o de color amarillo plido.  Concurra a todas las visitas de [address] se lo haya indicado el mdico. Esto es importante.  Asegrese de lo siguiente: ? Vaciar la vejiga con frecuencia y en su totalidad. No retener la orina durante largos perodos. ? Vaciar la vejiga antes y despus de Management consultant. ? Limpiarse de adelante hacia atrs despus de defecar, si es mujer. Usar cada trozo de papel higinico solo una vez cuando se limpie. Comunquese con un mdico si:  Siente dolor en la espalda.  Tiene fiebre.  Siente nuseas o vomita.  Los sntomas no mejoran despus de [address] [place].  Los sntomas desaparecen y luego vuelven a Research officer, trade union. Solicite ayuda de inmediato si:  Siente dolor intenso en la espalda o en la zona inferior del abdomen.  Tiene vmitos y no puede tragar los medicamentos ni tomar agua. Esta informacin no tiene Theme park manager el consejo del mdico. Asegrese de hacerle al mdico cualquier pregunta que tenga. Document Released: 10/16/2004 Document Revised: 04/23/2016 Document Reviewed: 11/27/2014 Elsevier Interactive Patient Education  [employer].

## 2017-03-17 LAB — URINE CULTURE
MICRO NUMBER: 90243323
SPECIMEN QUALITY: ADEQUATE

## 2017-03-17 LAB — URINALYSIS, COMPLETE W/RFL CULTURE
Bilirubin Urine: NEGATIVE
Glucose, UA: NEGATIVE
HYALINE CAST: NONE SEEN /LPF
Ketones, ur: NEGATIVE
Nitrites, Initial: POSITIVE — AB
Specific Gravity, Urine: 1.01 (ref 1.001–1.03)
pH: 6.5 (ref 5.0–8.0)

## 2017-03-17 LAB — CULTURE INDICATED

## 2017-03-19 ENCOUNTER — Encounter: Payer: Commercial Managed Care - PPO | Admitting: Obstetrics & Gynecology

## 2017-03-31 ENCOUNTER — Encounter: Payer: Self-pay | Admitting: Obstetrics & Gynecology

## 2017-03-31 ENCOUNTER — Ambulatory Visit: Payer: Commercial Managed Care - PPO | Admitting: Obstetrics & Gynecology

## 2017-03-31 VITALS — BP 126/78 | Ht 63.0 in | Wt 140.0 lb

## 2017-03-31 DIAGNOSIS — Z780 Asymptomatic menopausal state: Secondary | ICD-10-CM

## 2017-03-31 DIAGNOSIS — R87610 Atypical squamous cells of undetermined significance on cytologic smear of cervix (ASC-US): Secondary | ICD-10-CM | POA: Diagnosis not present

## 2017-03-31 DIAGNOSIS — Z01419 Encounter for gynecological examination (general) (routine) without abnormal findings: Secondary | ICD-10-CM

## 2017-03-31 DIAGNOSIS — N3090 Cystitis, unspecified without hematuria: Secondary | ICD-10-CM

## 2017-03-31 DIAGNOSIS — Z113 Encounter for screening for infections with a predominantly sexual mode of transmission: Secondary | ICD-10-CM | POA: Diagnosis not present

## 2017-03-31 DIAGNOSIS — Z1151 Encounter for screening for human papillomavirus (HPV): Secondary | ICD-10-CM

## 2017-03-31 MED ORDER — NITROFURANTOIN MONOHYD MACRO 100 MG PO CAPS: 100.0000 mg | ORAL_CAPSULE | Freq: Two times a day (BID) | ORAL | 0 refills | Status: AC

## 2017-03-31 NOTE — Progress Notes (Signed)
Suzanne Harrington Dec 21, 1965 [id number]   History:    52 y.o. [id number]  Married.  33, 41, 6, 68 and 52 yo.  RP:  Established patient presenting for annual gyn exam   HPI: Menoapause, well without HRT.  No PMB.  No pelvic pain.  C/O Urinary frequency, no pain with miction, no fever.  No pain with IC.  Normal vaginal secretions.  BMs wnl, but had a mild food intoxication 2 days ago.  Breasts wnl.  BMI 24.8.  Health labs here.  Past medical history,surgical history, family history and social history were all reviewed and documented in the EPIC chart.  Gynecologic History Patient's last menstrual period was 01/31/2014. Contraception: post menopausal status Last Pap: 02/2016. Results were: ASCUS/HPV HR negative Last mammogram: 02/2016. Results were: Negative Bone Density: Never.  Will start next year. Colonoscopy: Never.  Organizing now.  Obstetric History OB History  Gravida Para Term Preterm AB Living  '6 5 5   1 5  '$ SAB TAB Ectopic Multiple Live Births  1       5    # Outcome Date GA Lbr Len/2nd Weight Sex Delivery Anes PTL Lv  6 SAB           5 Term     M Vag-Spont  N LIV  4 Term     F Vag-Spont  N LIV  3 Term     F Vag-Spont  N LIV  2 Term     F Vag-Spont  N LIV  1 Term     F Vag-Spont  N LIV       ROS: A ROS was performed and pertinent positives and negatives are included in the history.  GENERAL: No fevers or chills. HEENT: No change in vision, no earache, sore throat or sinus congestion. NECK: No pain or stiffness. CARDIOVASCULAR: No chest pain or pressure. No palpitations. PULMONARY: No shortness of breath, cough or wheeze. GASTROINTESTINAL: No abdominal pain, nausea, vomiting or diarrhea, melena or bright red blood per rectum. GENITOURINARY: No urinary frequency, urgency, hesitancy or dysuria. MUSCULOSKELETAL: No joint or muscle pain, no back pain, no recent trauma. DERMATOLOGIC: No rash, no itching, no lesions. ENDOCRINE: No polyuria, polydipsia, no heat or cold intolerance. No  recent change in weight. HEMATOLOGICAL: No anemia or easy bruising or bleeding. NEUROLOGIC: No headache, seizures, numbness, tingling or weakness. PSYCHIATRIC: No depression, no loss of interest in normal activity or change in sleep pattern.     Exam:   BP 126/78   Ht '5\' 3"'$  (1.6 m)   Wt 140 lb (63.5 kg)   LMP 01/31/2014   BMI 24.80 kg/m   Body mass index is 24.8 kg/m.  General appearance : Well developed well nourished female. No acute distress HEENT: Eyes: no retinal hemorrhage or exudates,  Neck supple, trachea midline, no carotid bruits, no thyroidmegaly Lungs: Clear to auscultation, no rhonchi or wheezes, or rib retractions  Heart: Regular rate and rhythm, no murmurs or gallops Breast:Examined in sitting and supine position were symmetrical in appearance, no palpable masses or tenderness,  no skin retraction, no nipple inversion, no nipple discharge, no skin discoloration, no axillary or supraclavicular lymphadenopathy Abdomen: no palpable masses or tenderness, no rebound or guarding Extremities: no edema or skin discoloration or tenderness  Pelvic: Vulva: Normal             Vagina: No gross lesions or discharge  Cervix: No gross lesions or discharge  Pap/HR HPV/Gono-Chlam done  Uterus  AV, normal size,  shape and consistency, non-tender and mobile  Adnexa  Without masses or tenderness  Anus: Normal  U/A: Dog yellow, cloudy, nitrites positive, white blood cells 20-40, red blood cells 0-2, many bacteria.  Pending Urine Culture.   Assessment/Plan:  52 y.o. female for annual exam   1. Encounter for routine gynecological examination with Papanicolaou smear of cervix Normal gynecologic exam.  Pap with high risk HPV done today.  Breast exam normal.  Will repeat screening mammogram now.  Will help patient organize her first colonoscopy.  Will do health labs here. - CBC - Comp Met (CMET) - Lipid panel - TSH - VITAMIN D 25 Hydroxy (Vit-D Deficiency, Fractures) - Pap IG, CT/NG  NAA, and HPV (high risk)  2. ASCUS of cervix with negative high risk HPV Repeat Pap with high risk HPV today.  3. Menopause present Well on no hormone replacement therapy.  No postmenopausal bleeding.  4. Cystitis Had a cystitis with a positive culture to E. coli treated with Bactrim to which it was sensitive March 12, 2017.  Was well after that treatment and now developed an other probable cystitis.  Pending urine culture.  Will treat in the meantime with Macrobid 1 tablet twice a day for 7 days.  Usage of the antibiotic reviewed and prescription sent to pharmacy. - Urinalysis,Complete w/RFL Culture  5. Screen for STD (sexually transmitted disease) Condoms recommended. - HIV antibody (with reflex) - RPR - Hepatitis B Surface AntiGEN - Hepatitis C Antibody - Pap IG, CT/NG NAA, and HPV (high risk)  6. Special screening examination for human papillomavirus (HPV) - Pap IG, CT/NG NAA, and HPV (high risk)  Other orders - nitrofurantoin, macrocrystal-monohydrate, (MACROBID) 100 MG capsule; Take 1 capsule (100 mg total) by mouth 2 (two) times daily for 7 days.  Counseling on above issues more than 50% for 10 minutes.  Princess Bruins MD, 9:44 AM 03/31/2017

## 2017-03-31 NOTE — Patient Instructions (Signed)
1. Encounter for routine gynecological examination with Papanicolaou smear of cervix Normal gynecologic exam.  Pap with high risk HPV done today.  Breast exam normal.  Will repeat screening mammogram now.  Will help patient organize her first colonoscopy.  Will do health labs here. - CBC - Comp Met (CMET) - Lipid panel - TSH - VITAMIN D 25 Hydroxy (Vit-D Deficiency, Fractures) - Pap IG, CT/NG NAA, and HPV (high risk)  2. ASCUS of cervix with negative high risk HPV Repeat Pap with high risk HPV today.  3. Menopause present Well on no hormone replacement therapy.  No postmenopausal bleeding.  4. Cystitis Had a cystitis with a positive culture to E. coli treated with Bactrim to which it was sensitive March 12, 2017.  Was well after that treatment and now developed an other probable cystitis.  Pending urine culture.  Will treat in the meantime with Macrobid 1 tablet twice a day for 7 days.  Usage of the antibiotic reviewed and prescription sent to pharmacy. - Urinalysis,Complete w/RFL Culture  5. Screen for STD (sexually transmitted disease) Condoms recommended. - HIV antibody (with reflex) - RPR - Hepatitis B Surface AntiGEN - Hepatitis C Antibody - Pap IG, CT/NG NAA, and HPV (high risk)  6. Special screening examination for human papillomavirus (HPV) - Pap IG, CT/NG NAA, and HPV (high risk)  Other orders - nitrofurantoin, macrocrystal-monohydrate, (MACROBID) 100 MG capsule; Take 1 capsule (100 mg total) by mouth 2 (two) times daily for 7 days.  Shaila, fue un placer verle hoy!  Voy a informarle de sus [employer].   Mantenimiento de la salud de las mujeres posmenopusicas (Health Maintenance for Postmenopausal Women) La menopausia es un proceso normal en el cual se pierde la capacidad reproductiva. Este proceso ocurre gradualmente a lo largo de un perodo de meses o aos, por lo general entre los 63 y los 55aos. La menopausia es completa cuando no se han tenido  27mnstruaciones consecutivas. Es importante hablar con el mdico sobre algunas de las enfermedades ms comunes que afectan a las mujeres posmenopusicas, como la cardiopata coronaria, el cncer y la prdida de la masa sea (osteoporosis). Adoptar un estilo de vida saludable y recibir atencin preventiva pueden ayudar a promover la salud y eMusician Adems, estas medidas pueden reducir las probabilidades de desarrollar algunas de estas enfermedades frecuentes. QU DEBO SABER ACERCA DE LWillernie Durante le mHedley puede tener una serie de sntomas, por ejemplo:  Calores repentinos moderados a graves.  Sudoracin nocturna.  Disminucin del deseo sexual.  Cambios en el estado de nimo.  Dolores de cN[place]  Cansancio.  Irritabilidad.  Problemas de memoria.  Insomnio. Tratar o no los cambios que ocurren en la menopausia es una decisin personal que se toma con el mdico. QU DEBO SABER SOBRE LOS TRATAMIENTOS DE REPOSICIN HORMONAL Y LOS SUPLEMENTOS? Los productos para la terapia hormonal son eficaces para tratar los sntomas que se asocian con la menopausia, como los calores repentinos y las sudoraciones nocturnas. La reposicin hormonal conlleva ciertos riesgos, especialmente a medida que una mujer envejece. Si est pensando en usar tratamientos con estrgeno o estrgeno con progesterona, analice los beneficios y los riesgos con el mdico. QU DEBO SABER SWhite Island ShoresLDryden A medida que una persona envejece, aumenta la probabilidad de tener cardiopata coronaria, infarto de miocardio e ictus. Esto puede deberse, en parte, a los cambios hormonales que atraviesa el cuerpo durante la menopausia. Estos cambios pueden afectar la forma en que el organismo  procesa las grasas, los triglicridos y el colesterol de su dieta. El infarto de miocardio y el ictus son emergencias mdicas. Hay muchas cosas que se pueden hacer para ayudar a prevenir la cardiopata  coronaria y el ictus:  Debe controlar su presin arterial al menos cada uno o [place]. La hipertensin arterial causa enfermedades cardacas y [place] el riesgo de ictus.  Si tiene entre 68 y 79aos, consulte al mdico si debe tomar aspirina para prevenir un infarto de miocardio o un ictus.  No consuma ningn producto que contenga tabaco, lo que incluye cigarrillos, tabaco de Higher education careers adviser o Psychologist, sport and exercise. Si necesita ayuda para dejar de fumar, consulte al [employer].  Es importante seguir una dieta sana y Theatre manager un peso saludable. ? Asegrese de [employer] verduras, frutas, productos lcteos de bajo contenido de [place] y Advertising account planner. ? No consuma alimentos con alto contenido de grasas slidas, azcares agregados o sal (sodio).  Realice actividad fsica con regularidad. Esta es una de las cosas ms importantes que puede hacer por su salud. ? Intente realizar al menos 119mnutos de actividad fsica por semana. El tipo de ejercicio que realice debe aumentar la frecuencia cardaca y hacerla sudar. Esto se conoce como ejercicio de iMal[place] ? Intente hacer ejercicios de elongacin por lo menos dos veces por semana. Agrguelos al plan de ejercicio de intensidad moderada.  Conozca sus cifras. Pdale al mdico que le controle el colesterol y el nivel sanguneo de glucosa. Siga hacindose anlisis de sAmerican Electric Powerse lo haya indicado el mdico. QU DEBO SABER SOBRE LAS PRUEBAS DE DETECCIN DEL CNCER? Hay varios tipos de cncer. Tome las siguientes medidas para reducir el riesgo y dActuaryformacin cancerosa lo antes posible. Cncer de mama  Practique la autoconciencia de la mama. ? Esto significa reconocer la apariencia normal de sus mamas y cmo las siente. ? Tambin significa realizar autoexmenes regulares de las mSabin Informe a su mdico sobre cualquier cambio, sin importar cun pequeo sea.  Si es mayor de 40aos, visite a un mdico para qPublic librarianun  examen de las mamas (exploracin clnica mamaria o ECM) todos los aLucerne En funcin de sToysRus los antecedentes familiares y la historia cCyr tal vez sea recomendable que tambin se haga una radiografa anual de las mamas ([place].  Si tiene antecedentes familiares de cncer de mama, hable con el mdico para someterse a un estudio gentico.  Si tiene alto riesgo de pChief Financial Officerde mama, hable con el mdico para hacerse a uPublic house manager(RM) y uLavinia Sharpstodos los aDayton  La evaluacin del gen del cncer de mama (BRCA) se recomienda a las mujeres que tengan familiares con tumores malignos relacionados con el BRCA. Los resultados de la evaluacin determinarn la necesidad de recibir asesoramiento gentico y pBuilding services engineerde deteccin del BRCA1 y el BRCA2. Los tumores malignos relacionados con el BRCA incluyen estos tipos: ? Mama. Este tipo se presenta en hombres o mujeres. ? Ovario. ? Trompas. A este tipo tambin se lo llama cncer de trompa de [place]. ? Cncer de la pared abdominal o plvica (cncer de peritoneo). ? Prstata. ? Pncreas. Cncer de cuello uterino, de tero y de ovario El mdico puede recomendarle que se haga pruebas peridicas de deteccin de cncer de los rganos de la pelvis, los cuales iVerizonovarios, el tero y la vagina. Estas pruebas incluyen un examen plvico, que abarca controlar si se produjeron cambios microscpicos en la superficie del cuello del tero (prueba de Papanicolaou).  A las mujeres que [employer] 21 y 33aos, los mdicos pueden recomendarles que se realicen un examen plvico y Ardelia Mems prueba de Papanicolaou cada tres aos. A las mujeres que tienen entre 30 y 65aos, pueden recomendarles la prueba de Papanicolaou y el examen plvico, en combinacin con una prueba de deteccin del virus del papiloma humano (VPH) [place]. Algunos tipos de VPH aumentan el riesgo de Chief Financial Officer de cuello del tero. La prueba para la deteccin del VPH  tambin puede realizarse a mujeres de cualquier edad cuyos resultados de la prueba de Papanicolaou no sean claros.  Es posible que otros mdicos no recomienden exmenes de deteccin a las mujeres no embarazadas que se consideran sujetos de bajo riesgo de Chief Financial Officer de pelvis y no tienen sntomas. Pregntele al mdico si un examen plvico de deteccin es adecuado para usted.  Si ha recibido un tratamiento para Science writer cervical o una enfermedad que podra causar cncer, necesitar realizarse una prueba de Papanicolaou y controles durante al menos 65 aos de concluido el [place]. Si no se ha hecho el Papanicolaou con regularidad, debern volver a evaluarse los factores de riesgo (como tener un nuevo compaero sexual), para Teacher, adult education si debe empezar a Dispensing optician los estudios nuevamente. Algunas mujeres sufren problemas mdicos que aumentan la probabilidad de Museum/gallery curator cncer de cuello del tero. En estos casos, el mdico podr [employer] se realicen controles y pruebas de Papanicolaou con ms frecuencia.  Si tiene antecedentes familiares de cncer de tero o de ovario, hable con el mdico para someterse a un estudio gentico.  Si tiene hemorragia vaginal despus de la menopausia, informe al mdico.  En la actualidad, no hay pruebas confiables para la deteccin del cncer de ovario. Cncer de pulmn Se recomienda realizar exmenes de deteccin de cncer de pulmn a personas adultas entre 70 y 92 aos que estn en riesgo de Horticulturist, commercial de pulmn por sus antecedentes de consumo de tabaco. Se recomienda una tomografa computarizada (TC) de baja dosis de los pulmones todos los aos si usted:  Fuma actualmente.  Ha fumado durante 30aos un paquete diario y sigue fumando o dej el hbito en algn momento en los ltimos 15aos. Un paquete-ao equivale a fumar en promedio un paquete de cigarrillos diario durante un ao. Los exmenes de deteccin anuales:  Deben hacerse hasta que hayan  pasado 15aos desde que dej de fumar.  Deben dejar de realizarse si tiene un problema de salud que le impide recibir tratamiento para el cncer de pulmn. Cncer colorrectal  Este tipo de cncer puede detectarse y a menudo prevenirse.  El estudio de [place] de Programme researcher, broadcasting/film/video del cncer colorrectal debe comenzar a Electrical engineer a Proofreader de los 27aos y [place].  El mdico puede aconsejarle que lo haga antes, si tiene factores de riesgo de Best boy cncer de colon.  Si tiene antecedentes familiares de cncer colorrectal, hable con el mdico para someterse a un estudio gentico.  El mdico tambin puede recomendarle que use un kit de prueba para Engineer, mining a fin de Educational psychologist en la materia fecal.  Es posible que se use una pequea cmara en el extremo de un tubo para examinar directamente el colon (sigmoidoscopia o colonoscopia) a fin de Hydrographic surveyor formas tempranas de cncer colorrectal.  El examen directo del colon se debe repetir cada 5 a 10aos hasta los 91aos. Sin embargo, si se hallan formas incipientes de plipos precancerosos o pequeos tumores, o si tiene antecedentes familiares o riesgo gentico de  tener cncer colorrectal, debe realizarse exmenes de deteccin con ms frecuencia. Cncer de piel  Revise la piel de la cabeza a los pies con regularidad.  Contrlese los lunares. Infrmele al mdico: ? Si aparecen nuevos lunares o los que tiene se modifican, especialmente en su forma o color. ? Si tiene un lunar que es ms grande que el tamao de una goma de Games developer.  Si alguno de los miembros de su familia tiene antecedentes de cncer de piel, especialmente a una edad temprana, hable con el mdico para someterse a pruebas genticas.  Siempre use pantalla solar. Aplique pantalla solar de Kerry Dory y repetida a lo largo del Training and development officer.  Protjase usando mangas y [employer], un sombrero de ala ancha y gafas para el sol, siempre que est al [place]. QU DEBO SABER  SOBRE LA OSTEOPOROSIS? La osteoporosis es una afeccin en la cual la destruccin de la masa sea ocurre con mayor rapidez que su formacin. Despus de la menopausia, puede correr un riesgo ms alto de tener osteoporosis. Para ayudar a prevenir esta afeccin o las fracturas seas que pueden ocurrir a causa de [place], se recomienda lo siguiente:  Si tiene entre 19 y 50aos, tome como mnimo '1000mg'$  de calcio y '600mg'$  de vitaminaD por Training and development officer.  Si es mayor de 50aos pero menor de 70aos, tome como mnimo '1200mg'$  de calcio y '600mg'$  de vitaminaD por Training and development officer.  Si es mayor de 70aos, tome como mnimo '1200mg'$  de calcio y '800mg'$  de vitaminaD por Training and development officer. El tabaquismo y el consumo excesivo de alcohol aumentan el riesgo de osteoporosis. Consuma alimentos ricos en calcio y vitaminaD, y haga ejercicios con soporte de peso varias veces a la semana, como se lo haya indicado el mdico. QU DEBO SABER SOBRE EL MODO EN QUE LA MENOPAUSIA AFECTA MI SALUD MENTAL? La depresin puede presentarse a cualquier edad, pero es ms frecuente a medida que una persona envejece. Los sntomas comunes de depresin incluyen lo siguiente:  Desnimo o tristeza.  Cambios en los patrones de sueo.  Cambios en el apetito o en los hbitos de alimentacin.  Sensacin de falta general de motivacin o placer al [employer] actividades que sola disfrutar.  Crisis frecuentes de llanto. Hable con el mdico si cree que tiene depresin. QU DEBO SABER SOBRE LAS VACUNAS? Es importante que se aplique las vacunas y [place]. Estas incluyen las siguientes:  Vacuna contra el ttanos, la difteria y la tosferina (Tdap).  Vacuna anual contra la gripe antes del inicio de la temporada de gripe.  Vacuna contra la neumona.  Vacuna contra el herpes. El mdico tambin puede recomendarle que se aplique otras vacunas. Esta informacin no tiene Marine scientist el consejo del mdico. Asegrese de hacerle al mdico cualquier pregunta  que tenga. Document Released: 10/27/2012 Document Revised: 01/27/2014 Document Reviewed: 10/10/2014 Elsevier Interactive Patient Education  2018 [employer].

## 2017-04-01 ENCOUNTER — Telehealth: Payer: Self-pay | Admitting: *Deleted

## 2017-04-01 ENCOUNTER — Encounter: Payer: Self-pay | Admitting: Gastroenterology

## 2017-04-01 DIAGNOSIS — Z1211 Encounter for screening for malignant neoplasm of colon: Secondary | ICD-10-CM

## 2017-04-01 LAB — CBC
HEMATOCRIT: 40.3 % (ref 35.0–45.0)
HEMOGLOBIN: 13.5 g/dL (ref 11.7–15.5)
MCH: 27.6 pg (ref 27.0–33.0)
MCHC: 33.5 g/dL (ref 32.0–36.0)
MCV: 82.4 fL (ref 80.0–100.0)
MPV: 11.6 fL (ref 7.5–12.5)
Platelets: 297 10*3/uL (ref 140–400)
RBC: 4.89 10*6/uL (ref 3.80–5.10)
RDW: 13.4 % (ref 11.0–15.0)
WBC: 4.5 10*3/uL (ref 3.8–10.8)

## 2017-04-01 LAB — COMPREHENSIVE METABOLIC PANEL
AG RATIO: 1.4 (calc) (ref 1.0–2.5)
ALBUMIN MSPROF: 4.1 g/dL (ref 3.6–5.1)
ALT: 24 U/L (ref 6–29)
AST: 22 U/L (ref 10–35)
Alkaline phosphatase (APISO): 103 U/L (ref 33–130)
BILIRUBIN TOTAL: 0.8 mg/dL (ref 0.2–1.2)
BUN: 13 mg/dL (ref 7–25)
CO2: 25 mmol/L (ref 20–32)
Calcium: 9.5 mg/dL (ref 8.6–10.4)
Chloride: 106 mmol/L (ref 98–110)
Creat: 0.85 mg/dL (ref 0.50–1.05)
GLUCOSE: 101 mg/dL — AB (ref 65–99)
Globulin: 3 g/dL (calc) (ref 1.9–3.7)
POTASSIUM: 4 mmol/L (ref 3.5–5.3)
SODIUM: 141 mmol/L (ref 135–146)
TOTAL PROTEIN: 7.1 g/dL (ref 6.1–8.1)

## 2017-04-01 LAB — LIPID PANEL
CHOL/HDL RATIO: 3 (calc) (ref <5.0)
CHOLESTEROL: 205 mg/dL — AB (ref <200)
HDL: 68 mg/dL (ref >50)
LDL Cholesterol (Calc): 116 mg/dL (calc) — ABNORMAL HIGH
Non-HDL Cholesterol (Calc): 137 mg/dL (calc) — ABNORMAL HIGH (ref <130)
Triglycerides: 104 mg/dL (ref <150)

## 2017-04-01 LAB — HEPATITIS C ANTIBODY
Hepatitis C Ab: NONREACTIVE
SIGNAL TO CUT-OFF: 0.04 (ref <1.00)

## 2017-04-01 LAB — TSH: TSH: 2.08 m[IU]/L

## 2017-04-01 LAB — RPR: RPR Ser Ql: NONREACTIVE

## 2017-04-01 LAB — HIV ANTIBODY (ROUTINE TESTING W REFLEX): HIV: NONREACTIVE

## 2017-04-01 LAB — HEPATITIS B SURFACE ANTIGEN: Hepatitis B Surface Ag: NONREACTIVE

## 2017-04-01 LAB — VITAMIN D 25 HYDROXY (VIT D DEFICIENCY, FRACTURES): VIT D 25 HYDROXY: 23 ng/mL — AB (ref 30–100)

## 2017-04-01 NOTE — Telephone Encounter (Signed)
Referral placed at [facility] they will call pt to schedule. 

## 2017-04-01 NOTE — Telephone Encounter (Signed)
-----   Message from Genia DelMarie-Lyne Lavoie, MD sent at 03/31/2017 10:32 AM EDT ----- Regarding: screening Colonoscopy 52 yo needs to schedule first Screening Colono.

## 2017-04-02 NOTE — Telephone Encounter (Signed)
Pt scheduled on 04/30/17 

## 2017-04-03 LAB — PAP IG, CT-NG NAA, HPV HIGH-RISK
C. TRACHOMATIS RNA, TMA: NOT DETECTED
HPV DNA HIGH RISK: NOT DETECTED
N. GONORRHOEAE RNA, TMA: NOT DETECTED

## 2017-04-03 LAB — URINALYSIS, COMPLETE W/RFL CULTURE
BILIRUBIN URINE: NEGATIVE
Glucose, UA: NEGATIVE
HYALINE CAST: NONE SEEN /LPF
KETONES UR: NEGATIVE
NITRITES URINE, INITIAL: POSITIVE — AB
PH: 7.5 (ref 5.0–8.0)
Protein, ur: NEGATIVE
SPECIFIC GRAVITY, URINE: 1.02 (ref 1.001–1.03)

## 2017-04-03 LAB — CULTURE INDICATED

## 2017-04-03 LAB — URINE CULTURE
MICRO NUMBER:: 90318425
SPECIMEN QUALITY:: ADEQUATE

## 2017-04-14 ENCOUNTER — Other Ambulatory Visit: Payer: Self-pay | Admitting: Obstetrics & Gynecology

## 2017-04-14 DIAGNOSIS — Z1231 Encounter for screening mammogram for malignant neoplasm of breast: Secondary | ICD-10-CM

## 2017-04-30 ENCOUNTER — Encounter: Payer: Self-pay | Admitting: Gastroenterology

## 2017-04-30 ENCOUNTER — Other Ambulatory Visit: Payer: Self-pay

## 2017-04-30 ENCOUNTER — Ambulatory Visit (AMBULATORY_SURGERY_CENTER): Payer: Self-pay

## 2017-04-30 VITALS — Ht 63.0 in | Wt 140.0 lb

## 2017-04-30 DIAGNOSIS — Z1211 Encounter for screening for malignant neoplasm of colon: Secondary | ICD-10-CM

## 2017-04-30 MED ORDER — NA SULFATE-K SULFATE-MG SULF 17.5-3.13-1.6 GM/177ML PO SOLN: 1.0000 | Freq: Once | ORAL | 0 refills | Status: AC

## 2017-04-30 NOTE — Progress Notes (Signed)
Denies allergies to eggs or soy products. Denies complication of anesthesia or sedation. Denies use of weight loss medication. Denies use of O2.   Emmi instructions declined.   Pre- Visit was conducted with interpreter Marly.

## 2017-05-05 ENCOUNTER — Ambulatory Visit
Admission: RE | Admit: 2017-05-05 | Discharge: 2017-05-05 | Disposition: A | Discharge status: Home / Self Care | Payer: Commercial Managed Care - PPO | Source: Ambulatory Visit | Attending: Obstetrics & Gynecology | Admitting: Obstetrics & Gynecology

## 2017-05-05 DIAGNOSIS — Z1231 Encounter for screening mammogram for malignant neoplasm of breast: Secondary | ICD-10-CM

## 2017-05-14 ENCOUNTER — Other Ambulatory Visit: Payer: Self-pay

## 2017-05-14 ENCOUNTER — Ambulatory Visit (AMBULATORY_SURGERY_CENTER): Payer: Commercial Managed Care - PPO | Admitting: Gastroenterology

## 2017-05-14 ENCOUNTER — Encounter: Payer: Self-pay | Admitting: Gastroenterology

## 2017-05-14 VITALS — BP 107/74 | HR 75 | Temp 98.4°F | Resp 12 | Ht 63.0 in | Wt 140.0 lb

## 2017-05-14 DIAGNOSIS — Z1212 Encounter for screening for malignant neoplasm of rectum: Secondary | ICD-10-CM | POA: Diagnosis not present

## 2017-05-14 DIAGNOSIS — Z1211 Encounter for screening for malignant neoplasm of colon: Secondary | ICD-10-CM | POA: Diagnosis present

## 2017-05-14 MED ORDER — SODIUM CHLORIDE 0.9 % IV SOLN
500.0000 mL | Freq: Once | INTRAVENOUS | Status: DC
Start: 2017-05-14 — End: 2018-05-25

## 2017-05-14 NOTE — Op Note (Signed)
[facility] Patient Name: Suzanne Harrington Procedure Date: 05/14/2017 9:01 AM MRN: [id number] Endoscopist: Napoleon Form , MD Age: 52 Referring MD:  Date of Birth: January 08, 1966 Gender: Female Account #: [id number] Procedure:                Colonoscopy Indications:              Screening for colorectal malignant neoplasm, This                            is the patient's first colonoscopy Medicines:                Monitored Anesthesia Care Procedure:                Pre-Anesthesia Assessment:                           - Prior to the procedure, a History and Physical                            was performed, and patient medications and                            allergies were reviewed. The patient's tolerance of                            previous anesthesia was also reviewed. The risks                            and benefits of the procedure and the sedation                            options and risks were discussed with the patient.                            All questions were answered, and informed consent                            was obtained. Prior Anticoagulants: The patient has                            taken no previous anticoagulant or antiplatelet                            agents. ASA Grade Assessment: I - A normal, healthy                            patient. After reviewing the risks and benefits,                            the patient was deemed in satisfactory condition to                            undergo the procedure.  After obtaining informed consent, the colonoscope                            was passed under direct vision. Throughout the                            procedure, the patient's blood pressure, pulse, and                            oxygen saturations were monitored continuously. The                            Model PCF-[id number]) scope was introduced                            through the anus and advanced to  the the terminal                            ileum, with identification of the appendiceal                            orifice and IC valve. The colonoscopy was performed                            without difficulty. The patient tolerated the                            procedure well. The quality of the bowel                            preparation was excellent. The terminal ileum,                            ileocecal valve, appendiceal orifice, and rectum                            were photographed. Scope In: 9:07:20 AM Scope Out: 9:18:09 AM Scope Withdrawal Time: 0 hours 7 minutes 50 seconds  Total Procedure Duration: 0 hours 10 minutes 49 seconds  Findings:                 The perianal and digital rectal examinations were                            normal.                           Non-bleeding internal hemorrhoids were found during                            retroflexion. The hemorrhoids were small. Complications:            No immediate complications. Estimated Blood Loss:     Estimated blood loss: none. Impression:               - Non-bleeding internal hemorrhoids.                           -  No specimens collected. Recommendation:           - Patient has a contact number available for                            emergencies. The signs and symptoms of potential                            delayed complications were discussed with the                            patient. Return to normal activities tomorrow.                            Written discharge instructions were provided to the                            patient.                           - Resume previous diet.                           - Continue present medications.                           - Repeat colonoscopy in 10 years for screening                            purposes. Napoleon FormKavitha V. Marnie Fazzino, MD 05/14/2017 9:22:19 AM This report has been signed electronically.

## 2017-05-14 NOTE — Progress Notes (Signed)
Report to PACU, RN, vss, BBS= Clear.  

## 2017-05-14 NOTE — Progress Notes (Signed)
I have reviewed the patient's medical history in detail and updated the computerized patient record.

## 2017-05-14 NOTE — Patient Instructions (Signed)
YOU HAD AN ENDOSCOPIC PROCEDURE TODAY AT THE [facility]:   Refer to the procedure report that was given to you for any specific questions about what was found during the examination.  If the procedure report does not answer your questions, please call your gastroenterologist to clarify.  If you requested that your care partner not be given the details of your procedure findings, then the procedure report has been included in a sealed envelope for you to review at your convenience later.  YOU SHOULD EXPECT: Some feelings of bloating in the abdomen. Passage of more gas than usual.  Walking can help get rid of the air that was put into your GI tract during the procedure and reduce the bloating. If you had a lower endoscopy (such as a colonoscopy or flexible sigmoidoscopy) you may notice spotting of blood in your stool or on the toilet paper. If you underwent a bowel prep for your procedure, you may not have a normal bowel movement for a few days.  Please Note:  You might notice some irritation and congestion in your nose or some drainage.  This is from the oxygen used during your procedure.  There is no need for concern and it should clear up in a day or so.  SYMPTOMS TO REPORT IMMEDIATELY:   Following lower endoscopy (colonoscopy or flexible sigmoidoscopy):  Excessive amounts of blood in the stool  Significant tenderness or worsening of abdominal pains  Swelling of the abdomen that is new, acute  Fever of 100F or higher   Following upper endoscopy (EGD)  Vomiting of blood or coffee ground material  New chest pain or pain under the shoulder blades  Painful or persistently difficult swallowing  New shortness of breath  Fever of 100F or higher  Black, tarry-looking stools  For urgent or emergent issues, a gastroenterologist can be reached at any hour by calling [phone].   DIET:  We do recommend a small meal at first, but then you may proceed to your regular diet.  Drink  plenty of fluids but you should avoid alcoholic beverages for 24 hours.  ACTIVITY:  You should plan to take it easy for the rest of today and you should NOT DRIVE or use heavy machinery until tomorrow (because of the sedation medicines used during the test).    FOLLOW UP: Our staff will call the number listed on your records the next business day following your procedure to check on you and address any questions or concerns that you may have regarding the information given to you following your procedure. If we do not reach you, we will leave a message.  However, if you are feeling well and you are not experiencing any problems, there is no need to return our call.  We will assume that you have returned to your regular daily activities without incident.  If any biopsies were taken you will be contacted by phone or by letter within the next 1-3 weeks.  Please call us at [phone] if you have not heard about the biopsies in 3 weeks.    SIGNATURES/CONFIDENTIALITY: You and/or your care partner have signed paperwork which will be entered into your electronic medical record.  These signatures attest to the fact that that the information above on your After Visit Summary has been reviewed and is understood.  Full responsibility of the confidentiality of this discharge information lies with you and/or your care-partner.   Hemorrhoid and post colonscopy instructions given in Spanish to pt.  And Molli Hazardngish to her husband  Recall 10 years-2029

## 2017-05-15 ENCOUNTER — Telehealth: Payer: Self-pay | Admitting: *Deleted

## 2017-05-15 NOTE — Telephone Encounter (Signed)
  Follow up Call-  Call back number 05/14/2017  Post procedure Call Back phone  # [phone] Suzanne Harrington husband  Permission to leave phone message Yes  Some recent data might be hidden     Patient questions:  Do you have a fever, pain , or abdominal swelling? No. Pain Score  0 *  Have you tolerated food without any problems? Yes.    Have you been able to return to your normal activities? Yes.    Do you have any questions about your discharge instructions: Diet   No. Medications  No. Follow up visit  No.  Do you have questions or concerns about your Care? No.  Actions: * If pain score is 4 or above: No action needed, pain <4.

## 2017-08-29 IMAGING — MG MM SCREEN MAMMOGRAM BILATERAL
4 series · 4 of 4 positions shown · non-contrast
Comparison: Previous exam(s).

CLINICAL DATA: Screening.

EXAM:
DIGITAL SCREENING BILATERAL MAMMOGRAM WITH CAD

[R CC]
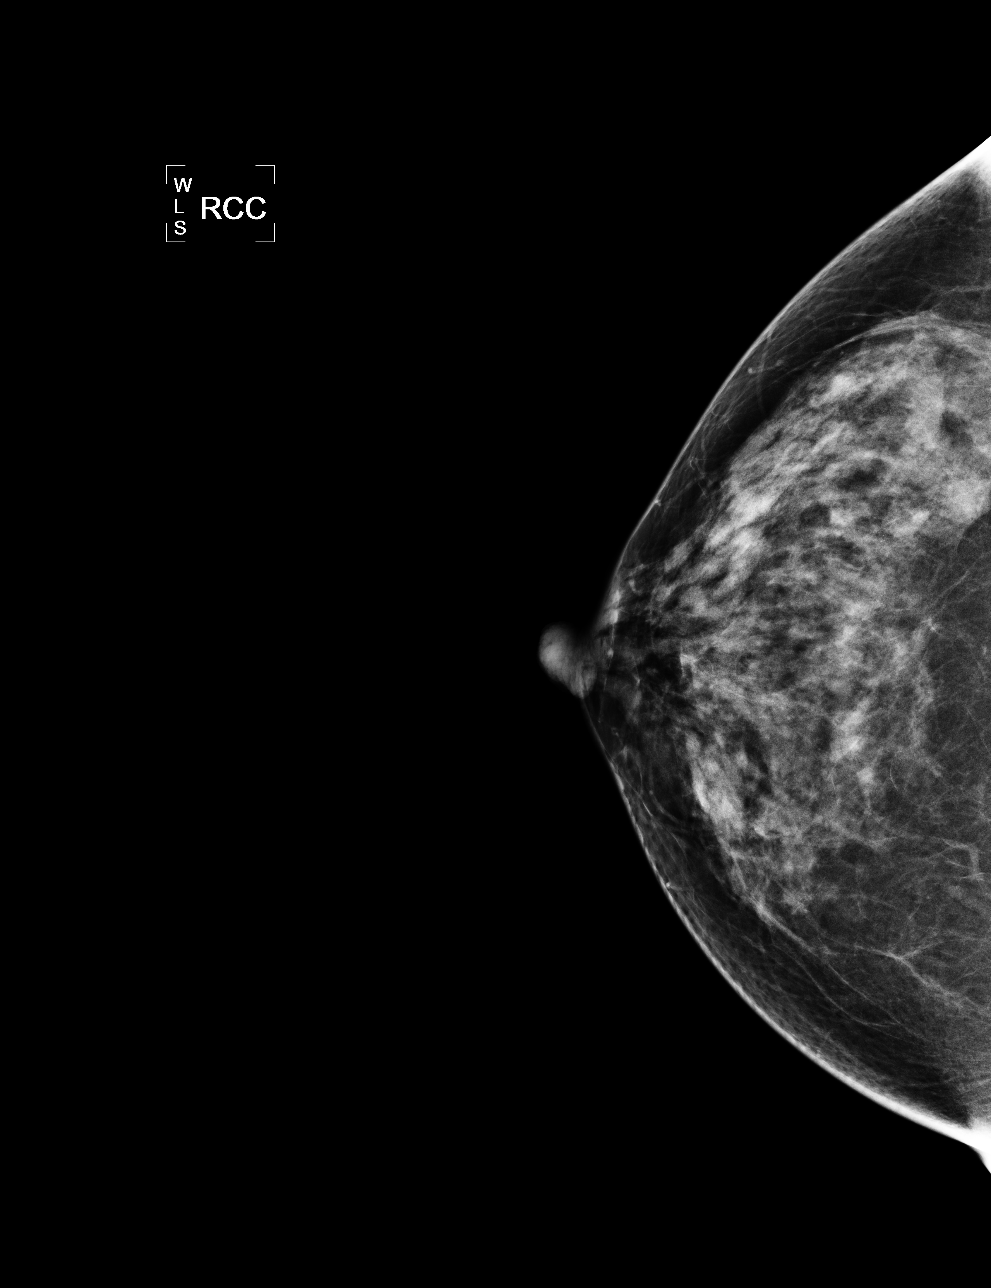

[L CC]
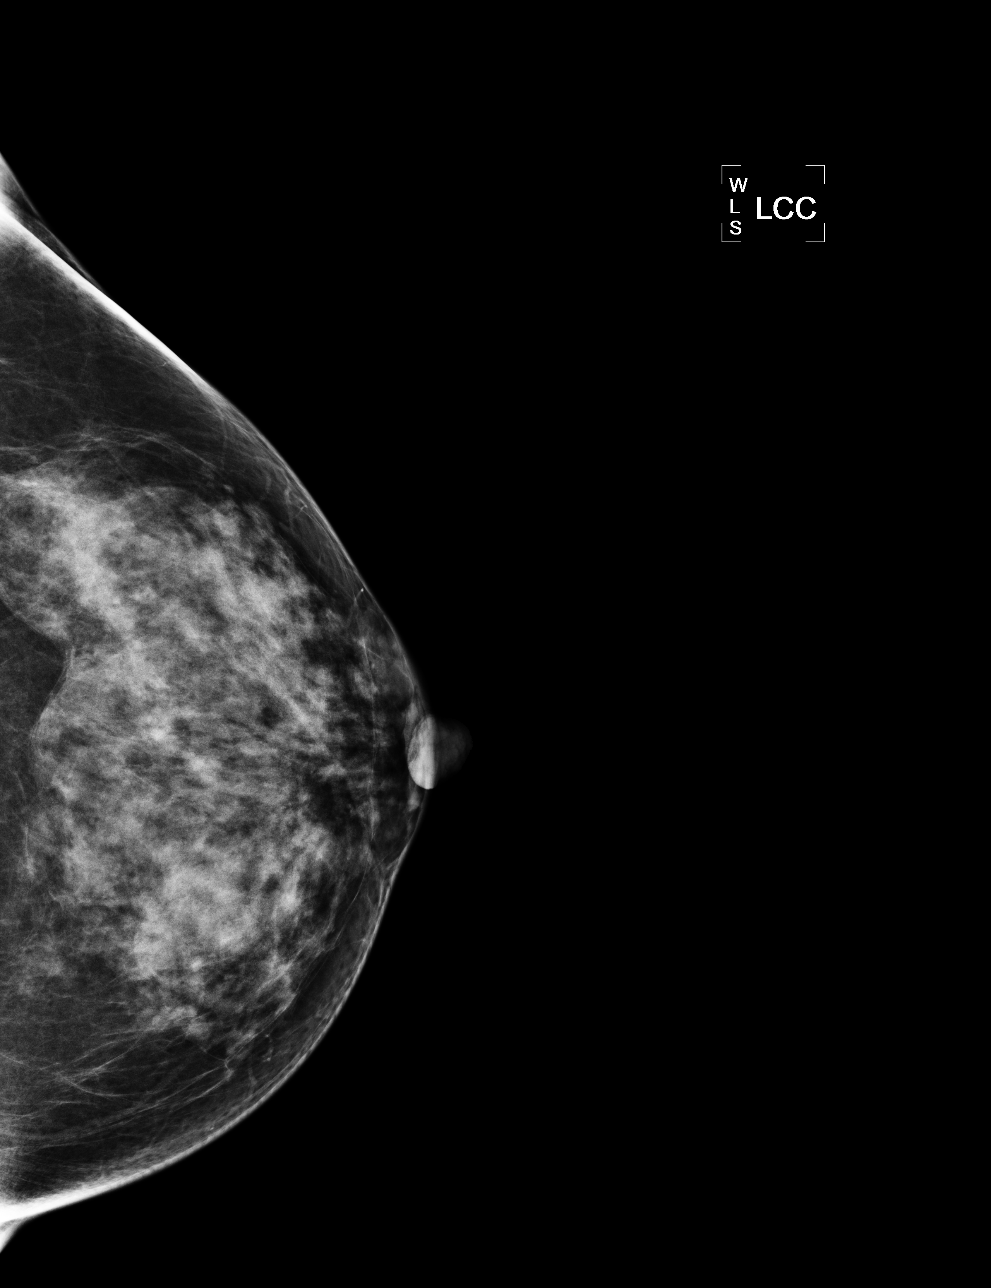

[L MLO]
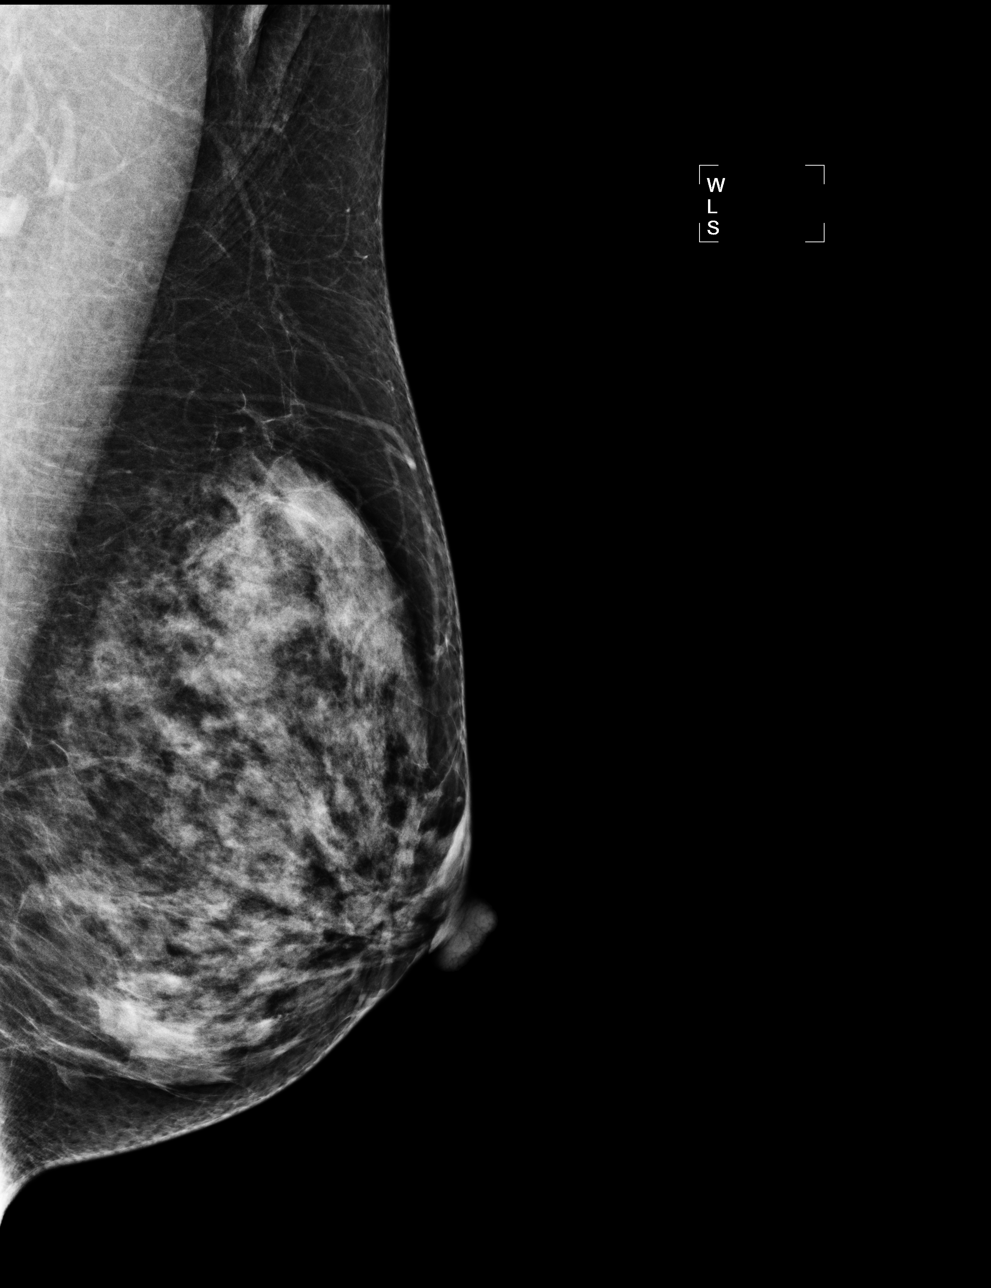

[R MLO]
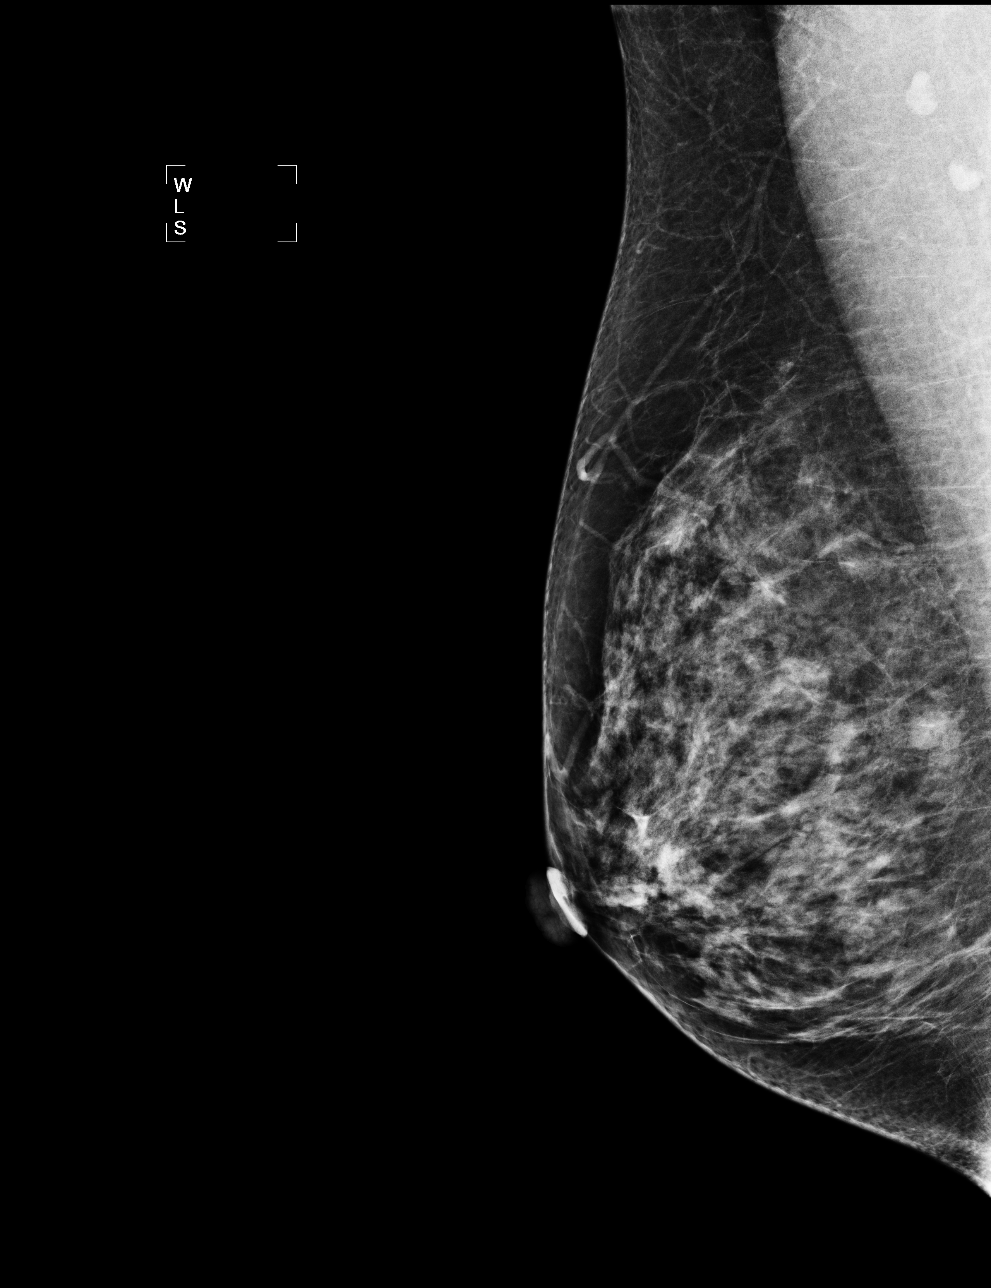

[4 of 4 positions shown; findings below may reference images not displayed]

ACR Breast Density Category c: The breast tissue is heterogeneously
dense, which may obscure small masses.
FINDINGS: There are no findings suspicious for malignancy. Images were
processed with CAD.
IMPRESSION: No mammographic evidence of malignancy. A result letter of this
screening mammogram will be mailed directly to the patient.

RECOMMENDATION:
Screening mammogram in one year. (Code:YJ-[id number])

BI-RADS CATEGORY  1: Negative.

## 2018-04-02 ENCOUNTER — Ambulatory Visit: Payer: Commercial Managed Care - PPO | Admitting: Obstetrics & Gynecology

## 2018-04-02 ENCOUNTER — Encounter (INDEPENDENT_AMBULATORY_CARE_PROVIDER_SITE_OTHER): Payer: Self-pay

## 2018-04-02 ENCOUNTER — Encounter: Payer: Self-pay | Admitting: Obstetrics & Gynecology

## 2018-04-02 ENCOUNTER — Other Ambulatory Visit: Payer: Self-pay

## 2018-04-02 VITALS — BP 112/70 | Ht 62.0 in | Wt 144.0 lb

## 2018-04-02 DIAGNOSIS — Z780 Asymptomatic menopausal state: Secondary | ICD-10-CM

## 2018-04-02 DIAGNOSIS — R87610 Atypical squamous cells of undetermined significance on cytologic smear of cervix (ASC-US): Secondary | ICD-10-CM | POA: Diagnosis not present

## 2018-04-02 DIAGNOSIS — R8290 Unspecified abnormal findings in urine: Secondary | ICD-10-CM | POA: Diagnosis not present

## 2018-04-02 DIAGNOSIS — Z01419 Encounter for gynecological examination (general) (routine) without abnormal findings: Secondary | ICD-10-CM

## 2018-04-02 MED ORDER — CIPROFLOXACIN HCL 500 MG PO TABS: 500.0000 mg | ORAL_TABLET | Freq: Two times a day (BID) | ORAL | 0 refills | Status: AC

## 2018-04-02 NOTE — Progress Notes (Signed)
Suzanne Harrington 06-Dec-1965 [id number]   History:    53 y.o. [id number] married.  Children from 52-79 year old.  RP:  Established patient presenting for annual gyn exam   HPI: Menopause, well on no hormone replacement therapy.  No postmenopausal bleeding.  No pelvic pain.  No pain with intercourse.  Urine and bowel movements normal.  Breasts normal.  Body mass index 26.34.  Low level of physical activity.  Health labs with family physician.  Past medical history,surgical history, family history and social history were all reviewed and documented in the EPIC chart.  Gynecologic History Patient's last menstrual period was 01/31/2014. Contraception: post menopausal status Last Pap: 03/2017. Results were: ASCUS/HPV HR negative Last mammogram: 04/2017. Results were: Negative Bone Density: Never Colonoscopy: 2019  Obstetric History OB History  Gravida Para Term Preterm AB Living  _0 SAB TAB Ectopic Multiple Live Births  1       5    # Outcome Date GA Lbr Len/2nd Weight Sex Delivery Anes PTL Lv  6 SAB           5 Term     M Vag-Spont  N LIV  4 Term     F Vag-Spont  N LIV  3 Term     F Vag-Spont  N LIV  2 Term     F Vag-Spont  N LIV  1 Term     F Vag-Spont  N LIV     ROS: A ROS was performed and pertinent positives and negatives are included in the history.  GENERAL: No fevers or chills. HEENT: No change in vision, no earache, sore throat or sinus congestion. NECK: No pain or stiffness. CARDIOVASCULAR: No chest pain or pressure. No palpitations. PULMONARY: No shortness of breath, cough or wheeze. GASTROINTESTINAL: No abdominal pain, nausea, vomiting or diarrhea, melena or bright red blood per rectum. GENITOURINARY: No urinary frequency, urgency, hesitancy or dysuria. MUSCULOSKELETAL: No joint or muscle pain, no back pain, no recent trauma. DERMATOLOGIC: No rash, no itching, no lesions. ENDOCRINE: No polyuria, polydipsia, no heat or cold intolerance. No recent change in weight.  HEMATOLOGICAL: No anemia or easy bruising or bleeding. NEUROLOGIC: No headache, seizures, numbness, tingling or weakness. PSYCHIATRIC: No depression, no loss of interest in normal activity or change in sleep pattern.     Exam:   BP 112/70   Ht 5' 2" (1.575 m)   Wt 144 lb (65.3 kg)   LMP 01/31/2014   BMI 26.34 kg/m   Body mass index is 26.34 kg/m.  General appearance : Well developed well nourished female. No acute distress HEENT: Eyes: no retinal hemorrhage or exudates,  Neck supple, trachea midline, no carotid bruits, no thyroidmegaly Lungs: Clear to auscultation, no rhonchi or wheezes, or rib retractions  Heart: Regular rate and rhythm, no murmurs or gallops Breast:Examined in sitting and supine position were symmetrical in appearance, no palpable masses or tenderness,  no skin retraction, no nipple inversion, no nipple discharge, no skin discoloration, no axillary or supraclavicular lymphadenopathy Abdomen: no palpable masses or tenderness, no rebound or guarding Extremities: no edema or skin discoloration or tenderness  Pelvic: Vulva: Normal             Vagina: No gross lesions or discharge  Cervix: No gross lesions or discharge.  Pap reflex done.  Uterus  AV, normal size, shape and consistency, non-tender and mobile  Adnexa  Without masses or tenderness  Anus: Normal  U/A: Dark yellow cloudy,  protein negative, nitrites negative, white blood cells 20-40, red blood cells 0-2, bacteria many.  Urine culture pending.   Assessment/Plan:  53 y.o. female for annual exam   1. Encounter for routine gynecological examination with Papanicolaou smear of cervix Normal gynecologic exam.  Pap reflex done.  Breast exam normal.  Schedule screening mammogram now.  Health labs with family physician.  BMI 26.34.  Recommend increase aerobic activities to 5 times a week and weightlifting every 2 days. - CBC - Comp Met (CMET) - TSH - Lipid panel - VITAMIN D 25 Hydroxy (Vit-D Deficiency,  Fractures)  2. ASCUS of cervix with negative high risk HPV Pap reflex done today.  3. Postmenopausal Well on no hormone replacement therapy.  No postmenopausal bleeding.  Recommend vitamin D supplements, calcium intake of 1.5 g/day and regular weightbearing physical activities.  4. Bad odor of urine Urine analysis abnormal.  Pending urine culture.  Will treat in the meantime with ciprofloxacin 500 mg/tab. 1 tablet twice a day for 5 days.  Usage reviewed with patient and prescription sent to pharmacy.  Other orders - ciprofloxacin (CIPRO) 500 MG tablet; Take 1 tablet (500 mg total) by mouth 2 (two) times daily for 5 days.  Counseling on above issues and coordination of care more than 50% for 10 minutes.  Princess Bruins MD, 9:09 AM 04/02/2018

## 2018-04-03 LAB — COMPREHENSIVE METABOLIC PANEL
AG RATIO: 1.6 (calc) (ref 1.0–2.5)
ALBUMIN MSPROF: 4.2 g/dL (ref 3.6–5.1)
ALKALINE PHOSPHATASE (APISO): 79 U/L (ref 37–153)
ALT: 15 U/L (ref 6–29)
AST: 20 U/L (ref 10–35)
BILIRUBIN TOTAL: 0.7 mg/dL (ref 0.2–1.2)
BUN: 16 mg/dL (ref 7–25)
CALCIUM: 9.6 mg/dL (ref 8.6–10.4)
CO2: 31 mmol/L (ref 20–32)
Chloride: 106 mmol/L (ref 98–110)
Creat: 0.75 mg/dL (ref 0.50–1.05)
GLOBULIN: 2.7 g/dL (ref 1.9–3.7)
Glucose, Bld: 96 mg/dL (ref 65–99)
POTASSIUM: 4.8 mmol/L (ref 3.5–5.3)
Sodium: 142 mmol/L (ref 135–146)
TOTAL PROTEIN: 6.9 g/dL (ref 6.1–8.1)

## 2018-04-03 LAB — LIPID PANEL
Cholesterol: 195 mg/dL (ref <200)
HDL: 68 mg/dL (ref >=50)
LDL Cholesterol (Calc): 111 mg/dL (calc) — ABNORMAL HIGH
NON-HDL CHOLESTEROL (CALC): 127 mg/dL (ref <130)
TRIGLYCERIDES: 73 mg/dL (ref <150)
Total CHOL/HDL Ratio: 2.9 (calc) (ref <5.0)

## 2018-04-03 LAB — CBC
HEMATOCRIT: 39.4 % (ref 35.0–45.0)
HEMOGLOBIN: 13.3 g/dL (ref 11.7–15.5)
MCH: 28.5 pg (ref 27.0–33.0)
MCHC: 33.8 g/dL (ref 32.0–36.0)
MCV: 84.4 fL (ref 80.0–100.0)
MPV: 11.7 fL (ref 7.5–12.5)
Platelets: 253 10*3/uL (ref 140–400)
RBC: 4.67 10*6/uL (ref 3.80–5.10)
RDW: 12.5 % (ref 11.0–15.0)
WBC: 4.9 10*3/uL (ref 3.8–10.8)

## 2018-04-03 LAB — VITAMIN D 25 HYDROXY (VIT D DEFICIENCY, FRACTURES): VIT D 25 HYDROXY: 32 ng/mL (ref 30–100)

## 2018-04-03 LAB — TSH: TSH: 1.86 m[IU]/L

## 2018-04-04 LAB — URINALYSIS, COMPLETE W/RFL CULTURE
BILIRUBIN URINE: NEGATIVE
GLUCOSE, UA: NEGATIVE
HYALINE CAST: NONE SEEN /LPF
KETONES UR: NEGATIVE
Nitrites, Initial: NEGATIVE
Protein, ur: NEGATIVE
SPECIFIC GRAVITY, URINE: 1.02 (ref 1.001–1.03)
pH: 7 (ref 5.0–8.0)

## 2018-04-04 LAB — URINE CULTURE
MICRO NUMBER:: 321519
SPECIMEN QUALITY:: ADEQUATE

## 2018-04-04 LAB — CULTURE INDICATED

## 2018-04-05 ENCOUNTER — Encounter: Payer: Self-pay | Admitting: Obstetrics & Gynecology

## 2018-04-05 NOTE — Patient Instructions (Signed)
1. Encounter for routine gynecological examination with Papanicolaou smear of cervix Normal gynecologic exam.  Pap reflex done.  Breast exam normal.  Schedule screening mammogram now.  Health labs with family physician.  BMI 26.34.  Recommend increase aerobic activities to 5 times a week and weightlifting every 2 days. - CBC - Comp Met (CMET) - TSH - Lipid panel - VITAMIN D 25 Hydroxy (Vit-D Deficiency, Fractures)  2. ASCUS of cervix with negative high risk HPV Pap reflex done today.  3. Postmenopausal Well on no hormone replacement therapy.  No postmenopausal bleeding.  Recommend vitamin D supplements, calcium intake of 1.5 g/day and regular weightbearing physical activities.  4. Bad odor of urine Urine analysis abnormal.  Pending urine culture.  Will treat in the meantime with ciprofloxacin 500 mg/tab. 1 tablet twice a day for 5 days.  Usage reviewed with patient and prescription sent to pharmacy.  Other orders - ciprofloxacin (CIPRO) 500 MG tablet; Take 1 tablet (500 mg total) by mouth 2 (two) times daily for 5 days.  Ona, fue un placer verle hoy!  Voy a informarle de sus [employer].

## 2018-04-06 LAB — PAP IG W/ RFLX HPV ASCU

## 2018-04-06 LAB — HUMAN PAPILLOMAVIRUS, HIGH RISK: HPV DNA HIGH RISK: NOT DETECTED

## 2018-05-24 ENCOUNTER — Other Ambulatory Visit: Payer: Self-pay

## 2018-05-25 ENCOUNTER — Ambulatory Visit: Payer: Commercial Managed Care - PPO | Admitting: Obstetrics & Gynecology

## 2018-05-25 ENCOUNTER — Encounter: Payer: Self-pay | Admitting: Obstetrics & Gynecology

## 2018-05-25 VITALS — BP 124/80

## 2018-05-25 DIAGNOSIS — K644 Residual hemorrhoidal skin tags: Secondary | ICD-10-CM | POA: Diagnosis not present

## 2018-05-25 DIAGNOSIS — R87610 Atypical squamous cells of undetermined significance on cytologic smear of cervix (ASC-US): Secondary | ICD-10-CM | POA: Diagnosis not present

## 2018-05-25 MED ORDER — HYDROCORTISONE (PERIANAL) 2.5 % EX CREA: 1.0000 "application " | TOPICAL_CREAM | Freq: Two times a day (BID) | CUTANEOUS | 3 refills | Status: AC

## 2018-05-25 NOTE — Progress Notes (Signed)
    Taraann Bogusz 03-Sep-1965 [id number]        53 y.o.  [id number] Married  RP: ASCUS x 2 for Colposcopy  HPI: ASCUS x 3 with HPV HR negative each time in 2018, 2019 and this year 03/2018.  Full STI work up negative 03/2017.  Menopause, well on no HRT.  No PMB.  No pelvic pain.  Normal vaginal secretions.  Complaining of burning and itching hemorrhoids, no rectal bleeding.   OB History  Gravida Para Term Preterm AB Living  6 5 5   1 5   SAB TAB Ectopic Multiple Live Births  1       5    # Outcome Date GA Lbr Len/2nd Weight Sex Delivery Anes PTL Lv  6 SAB           5 Term     M Vag-Spont  N LIV  4 Term     F Vag-Spont  N LIV  3 Term     F Vag-Spont  N LIV  2 Term     F Vag-Spont  N LIV  1 Term     F Vag-Spont  N LIV    Past medical history,surgical history, problem list, medications, allergies, family history and social history were all reviewed and documented in the EPIC chart.   Directed ROS with pertinent positives and negatives documented in the history of present illness/assessment and plan.  Exam:  Vitals:   05/25/18 0828  BP: 124/80   General appearance:  Normal  Colposcopy Procedure Note Kadijatou Neighbours 05/25/2018  Indications:  ASCUS x 3.  HPV HR negative  Procedure Details  The risks and benefits of the procedure and Verbal informed consent obtained.  Speculum placed in vagina and excellent visualization of cervix achieved, cervix swabbed x 3 with acetic acid solution.  Findings:  Cervix colposcopy: Physical Exam Genitourinary:       Vaginal colposcopy: Normal  Vulvar colposcopy: Normal  Perirectal colposcopy:  Normal.  Non-thrombosed external hemorrhoids.  The cervix was sprayed with Hurricane before performing the cervical biopsies.  Specimens:  Cervical biopsies at 3 and 8 O'Clock.  Complications:  None, good hemostasis with Silver Nitrate. . Plan:  Per cervical Bx results.    Assessment/Plan:  53 y.o. [id number]   1. ASCUS of cervix with  negative high risk HPV ASCUS x3 with high-risk HPV negative.  Significance of abnormal Pap test reviewed with patient.  Colposcopy procedure explained.  Colposcopy findings discussed.  Management per cervical biopsy results.  2. Inflamed external hemorrhoid Mildly inflamed symptomatic external Hemorrhoids, not thrombosed.  Will treat with hydrocortisone 2.5% rectal cream.  Usage reviewed and prescription sent to pharmacy.  Other orders - hydrocortisone (ANUSOL-HC) 2.5 % rectal cream; Place 1 application rectally 2 (two) times daily for 14 days.  Counseling on above issues and coordination of care more than 50% for 15 minutes.  Genia Del MD, 8:43 AM 05/25/2018

## 2018-05-25 NOTE — Patient Instructions (Signed)
1. ASCUS of cervix with negative high risk HPV ASCUS x3 with high-risk HPV negative.  Significance of abnormal Pap test reviewed with patient.  Colposcopy procedure explained.  Colposcopy findings discussed.  Management per cervical biopsy results.  2. Inflamed external hemorrhoid Mildly inflamed symptomatic external Hemorrhoids, not thrombosed.  Will treat with hydrocortisone 2.5% rectal cream.  Usage reviewed and prescription sent to pharmacy.  Other orders - hydrocortisone (ANUSOL-HC) 2.5 % rectal cream; Place 1 application rectally 2 (two) times daily for 14 days.  Jaretzy, fue un placer verle hoy!  Voy a informarle de sus [employer].

## 2018-05-27 LAB — TISSUE PATH REPORT

## 2018-05-27 LAB — PATHOLOGY REPORT

## 2018-07-09 LAB — HM PAP SMEAR: HM Pap smear: NEGATIVE

## 2018-11-12 ENCOUNTER — Other Ambulatory Visit: Payer: Self-pay | Admitting: Obstetrics & Gynecology

## 2018-11-12 DIAGNOSIS — K432 Incisional hernia without obstruction or gangrene: Secondary | ICD-10-CM

## 2018-11-15 ENCOUNTER — Other Ambulatory Visit: Payer: Self-pay

## 2018-11-15 ENCOUNTER — Ambulatory Visit
Admission: RE | Admit: 2018-11-15 | Discharge: 2018-11-15 | Disposition: A | Discharge status: Home / Self Care | Payer: Commercial Managed Care - PPO | Source: Ambulatory Visit | Attending: Obstetrics & Gynecology | Admitting: Obstetrics & Gynecology

## 2018-11-15 DIAGNOSIS — K432 Incisional hernia without obstruction or gangrene: Secondary | ICD-10-CM

## 2018-11-16 ENCOUNTER — Other Ambulatory Visit: Payer: Self-pay

## 2018-11-16 ENCOUNTER — Ambulatory Visit: Payer: Commercial Managed Care - PPO | Admitting: Obstetrics & Gynecology

## 2018-11-16 ENCOUNTER — Encounter: Payer: Self-pay | Admitting: Obstetrics & Gynecology

## 2018-11-16 VITALS — BP 126/78

## 2018-11-16 DIAGNOSIS — Z113 Encounter for screening for infections with a predominantly sexual mode of transmission: Secondary | ICD-10-CM

## 2018-11-16 DIAGNOSIS — R87610 Atypical squamous cells of undetermined significance on cytologic smear of cervix (ASC-US): Secondary | ICD-10-CM

## 2018-11-16 DIAGNOSIS — N766 Ulceration of vulva: Secondary | ICD-10-CM

## 2018-11-16 NOTE — Patient Instructions (Signed)
1. ASCUS of cervix with negative high risk HPV ASCUS x2 with high-risk HPV negative.  Last Pap was March 2020.  Colposcopy done May 2020 showing koilocytotic changes, not reaching dysplasia.  Repeat Pap test with high-risk HPV done today.  2. Vulvar ulcer Complaints of right vulvar burning, worse when in contact with urine.  3 small right vulvar ulcers on exam today.  Sure swab for herpes done on those ulcers.  Management per results.  Gonorrhea and chlamydia done.  Patient declined rest of STI work-up.  Follow-up in 1 week to reassess and perform a biopsy if HSV is negative. - SureSwab HSV, Type 1/2 DNA, PCR  Other orders - cholecalciferol (VITAMIN D3) 25 MCG (1000 UT) tablet; Take 1,000 Units by mouth daily.  Tishana, fue un placer verle hoy!  Voy a informarle de sus [employer].

## 2018-11-16 NOTE — Progress Notes (Signed)
    Suzanne Harrington 1965-06-30 [id number]        53 y.o.  [id number]  Married x 16 yrs  RP: Repeat Pap 6 months and Rt vulvar irritation x 1 week  HPI: ASCUS x 2, HPV HR neg 03/2018.  Colpo 05/2018 Koilocytotic changes, not reaching Dysplasia.  C/O a Rt Vulvar irritation x 8 days.  Worse burning when urine gets in contact with it.     OB History  Gravida Para Term Preterm AB Living  6 5 5   1 5   SAB TAB Ectopic Multiple Live Births  1       5    # Outcome Date GA Lbr Len/2nd Weight Sex Delivery Anes PTL Lv  6 SAB           5 Term     M Vag-Spont  N LIV  4 Term     F Vag-Spont  N LIV  3 Term     F Vag-Spont  N LIV  2 Term     F Vag-Spont  N LIV  1 Term     F Vag-Spont  N LIV    Past medical history,surgical history, problem list, medications, allergies, family history and social history were all reviewed and documented in the EPIC chart.   Directed ROS with pertinent positives and negatives documented in the history of present illness/assessment and plan.  Exam:  Vitals:   11/16/18 1402  BP: 126/78   General appearance:  Normal  Abdomen: Normal  Gynecologic exam: Vulva Rt with 3 small ulcers.  HSV SureSwab done.  Speculum:  Cervix/Vagina normal.  Normal secretions.  Pap/HPV HR and Gono-Chlam done.   Assessment/Plan:  53 y.o. [id number]   1. ASCUS of cervix with negative high risk HPV ASCUS x2 with high-risk HPV negative.  Last Pap was March 2020.  Colposcopy done May 2020 showing koilocytotic changes, not reaching dysplasia.  Repeat Pap test with high-risk HPV done today.  2. Vulvar ulcer Complaints of right vulvar burning, worse when in contact with urine.  3 small right vulvar ulcers on exam today.  Sure swab for herpes done on those ulcers.  Management per results.  Gonorrhea and chlamydia done.  Patient declined rest of STI work-up.  Follow-up in 1 week to reassess and perform a biopsy if HSV is negative. - SureSwab HSV, Type 1/2 DNA, PCR  Other orders - cholecalciferol  (VITAMIN D3) 25 MCG (1000 UT) tablet; Take 1,000 Units by mouth daily.  Counseling on above issues and coordination of care more than 50% for 25 minutes.  Princess Bruins MD, 2:34 PM 11/16/2018

## 2018-11-17 LAB — PAP IG, CT-NG NAA, HPV HIGH-RISK
C. trachomatis RNA, TMA: NOT DETECTED
HPV DNA High Risk: NOT DETECTED
N. gonorrhoeae RNA, TMA: NOT DETECTED

## 2018-11-20 LAB — SURESWAB HSV, TYPE 1/2 DNA, PCR
HSV 1 DNA: NOT DETECTED
HSV 2 DNA: DETECTED — AB

## 2018-11-22 ENCOUNTER — Other Ambulatory Visit: Payer: Self-pay | Admitting: Anesthesiology

## 2018-11-22 MED ORDER — VALACYCLOVIR HCL 500 MG PO TABS: 500.0000 mg | ORAL_TABLET | Freq: Two times a day (BID) | ORAL | 0 refills | Status: DC

## 2018-11-23 ENCOUNTER — Other Ambulatory Visit: Payer: Self-pay

## 2018-11-24 ENCOUNTER — Ambulatory Visit: Payer: Commercial Managed Care - PPO | Admitting: Obstetrics & Gynecology

## 2018-11-24 ENCOUNTER — Encounter: Payer: Self-pay | Admitting: Obstetrics & Gynecology

## 2018-11-24 VITALS — BP 130/86

## 2018-11-24 DIAGNOSIS — A6004 Herpesviral vulvovaginitis: Secondary | ICD-10-CM

## 2018-11-24 DIAGNOSIS — N766 Ulceration of vulva: Secondary | ICD-10-CM | POA: Diagnosis not present

## 2018-11-24 MED ORDER — VALACYCLOVIR HCL 500 MG PO TABS: 500.0000 mg | ORAL_TABLET | Freq: Every day | ORAL | 4 refills | Status: AC

## 2018-11-24 NOTE — Progress Notes (Signed)
    Suzanne Harrington 09/06/1965 [id number]        53 y.o.  [id number]  Married  RP: Counseling on Genital Herpes   HPI: Rt vulvar ulcers, HSV Type 2 positive.  Started Valacyclovir 1 g daily.  Vulvar ulcers healed.  Per patient, husband had ulcers on penis many years ago.  Gono-chlam negative 11/16/2018.   OB History  Gravida Para Term Preterm AB Living  6 5 5   1 5   SAB TAB Ectopic Multiple Live Births  1       5    # Outcome Date GA Lbr Len/2nd Weight Sex Delivery Anes PTL Lv  6 SAB           5 Term     M Vag-Spont  N LIV  4 Term     F Vag-Spont  N LIV  3 Term     F Vag-Spont  N LIV  2 Term     F Vag-Spont  N LIV  1 Term     F Vag-Spont  N LIV    Past medical history,surgical history, problem list, medications, allergies, family history and social history were all reviewed and documented in the EPIC chart.   Directed ROS with pertinent positives and negatives documented in the history of present illness/assessment and plan.  Exam:  Vitals:   11/24/18 1415  BP: 130/86   General appearance:  Normal  Gynecologic exam: Vulva:  Rt vulvar ulcers healed.  No new lesion.   Assessment/Plan:  53 y.o. [id number]   1. Vulvar ulcer Right vulvar ulcers healed.  Patient will stop valacyclovir treatment for this episode.  Gonorrhea and Chlamydia were negative November 16, 2018.  2. Herpes simplex vulvovaginitis Counseling done on genital herpes.  Decision to use prophylaxis at this time.  Valacyclovir usage, risks and benefits reviewed.  Prescription of valacyclovir 500 mg p.o. daily sent to pharmacy.  Other orders - valACYclovir (VALTREX) 500 MG tablet; Take 1 tablet (500 mg total) by mouth daily. Prophylaxis  Counseling on above issues and coordination of care more than 50% for 25 minutes.  Princess Bruins MD, 2:46 PM 11/24/2018

## 2018-11-24 NOTE — Patient Instructions (Signed)
1. Vulvar ulcer Right vulvar ulcers healed.  Patient will stop valacyclovir treatment for this episode.  Gonorrhea and Chlamydia were negative November 16, 2018.  2. Herpes simplex vulvovaginitis Counseling done on genital herpes.  Decision to use prophylaxis at this time.  Valacyclovir usage, risks and benefits reviewed.  Prescription of valacyclovir 500 mg p.o. daily sent to pharmacy.  Other orders - valACYclovir (VALTREX) 500 MG tablet; Take 1 tablet (500 mg total) by mouth daily. Prophylaxis  Suzanne Harrington, it was a pleasure seeing you today!

## 2019-04-05 ENCOUNTER — Encounter: Payer: Commercial Managed Care - PPO | Admitting: Obstetrics & Gynecology

## 2019-12-19 ENCOUNTER — Other Ambulatory Visit: Payer: Self-pay | Admitting: Obstetrics & Gynecology

## 2021-12-26 ENCOUNTER — Ambulatory Visit: Payer: Commercial Managed Care - PPO | Attending: Family Medicine | Admitting: Family Medicine

## 2021-12-26 ENCOUNTER — Other Ambulatory Visit (HOSPITAL_COMMUNITY)
Admission: RE | Admit: 2021-12-26 | Discharge: 2021-12-26 | Disposition: A | Discharge status: Home / Self Care | Payer: Self-pay | Source: Ambulatory Visit | Attending: Family Medicine | Admitting: Family Medicine

## 2021-12-26 ENCOUNTER — Encounter: Payer: Self-pay | Admitting: Family Medicine

## 2021-12-26 VITALS — BP 116/72 | HR 75 | Temp 98.3°F | Ht 63.0 in | Wt 144.6 lb

## 2021-12-26 DIAGNOSIS — Z13220 Encounter for screening for lipoid disorders: Secondary | ICD-10-CM

## 2021-12-26 DIAGNOSIS — Z0000 Encounter for general adult medical examination without abnormal findings: Secondary | ICD-10-CM

## 2021-12-26 DIAGNOSIS — Z13228 Encounter for screening for other metabolic disorders: Secondary | ICD-10-CM

## 2021-12-26 DIAGNOSIS — Z131 Encounter for screening for diabetes mellitus: Secondary | ICD-10-CM

## 2021-12-26 DIAGNOSIS — Z136 Encounter for screening for cardiovascular disorders: Secondary | ICD-10-CM

## 2021-12-26 DIAGNOSIS — Z1231 Encounter for screening mammogram for malignant neoplasm of breast: Secondary | ICD-10-CM

## 2021-12-26 DIAGNOSIS — Z124 Encounter for screening for malignant neoplasm of cervix: Secondary | ICD-10-CM

## 2021-12-26 NOTE — Progress Notes (Signed)
Subjective:  Patient ID: Suzanne Harrington, female    DOB: 01/14/1966  Age: 56 y.o. MRN: [id number]  CC: New Patient (Initial Visit)   HPI Suzanne Harrington is a 56 y.o. year old female with significant past medical history who presents today to establish care. She presents for annual physical exam.  Interval History: She is due for breast cancer and cervical cancer screening. Up-to-date on colonoscopy from 04/2017 with recommendation for repeat in 10 years. She does not exercise regularly and does not have an adequate intake of fruits and vegetables. Sees a dentist and ophthalmologist regularly. Denies additional concerns today.  Past Medical History:  Diagnosis Date   GERD (gastroesophageal reflux disease)    History of gestational diabetes    Wears glasses     Past Surgical History:  Procedure Laterality Date   MANUAL EXTRACTION PLACENTA  05-02-2005     MAC sedation   PUBOVAGINAL SLING N/A 04/27/2015   Procedure: ALTIS Freddi Che, CYSTOSCOPY;  Surgeon: Carolan Clines, MD;  Location: [facility];  Service: Urology;  Laterality: N/A;   TUBAL LIGATION Bilateral 05-10-2007    Family History  Problem Relation Age of Onset   Alzheimer's disease Mother    Breast cancer Neg Hx    Colon cancer Neg Hx    Esophageal cancer Neg Hx    Liver cancer Neg Hx    Pancreatic cancer Neg Hx    Rectal cancer Neg Hx    Stomach cancer Neg Hx     Social History   Socioeconomic History   Marital status: Married    Spouse name: Not on file   Number of children: Not on file   Years of education: Not on file   Highest education level: Not on file  Occupational History   Not on file  Tobacco Use   Smoking status: Never   Smokeless tobacco: Never  Vaping Use   Vaping Use: Never used  Substance and Sexual Activity   Alcohol use: No    Alcohol/week: 0.0 standard drinks of alcohol   Drug use: No   Sexual activity: Yes    Partners: Male    Birth control/protection:  Surgical    Comment: 1st intercourse- 74, partners- 18, married- 1 yrs   Other Topics Concern   Not on file  Social History Narrative   Not on file   Social Determinants of Health   Financial Resource Strain: Not on file  Food Insecurity: Not on file  Transportation Needs: Not on file  Physical Activity: Not on file  Stress: Not on file  Social Connections: Not on file    No Known Allergies  Outpatient Medications Prior to Visit  Medication Sig Dispense Refill   cholecalciferol (VITAMIN D3) 25 MCG (1000 UT) tablet Take 1,000 Units by mouth daily.     valACYclovir (VALTREX) 500 MG tablet Take 1 tablet (500 mg total) by mouth daily. Prophylaxis (Patient not taking: Reported on 12/26/2021) 90 tablet 4   No facility-administered medications prior to visit.     ROS Review of Systems  Constitutional:  Negative for activity change and appetite change.  HENT:  Negative for sinus pressure and sore throat.   Respiratory:  Negative for chest tightness, shortness of breath and wheezing.   Cardiovascular:  Negative for chest pain and palpitations.  Gastrointestinal:  Negative for abdominal distention, abdominal pain and constipation.  Genitourinary: Negative.   Musculoskeletal: Negative.   Psychiatric/Behavioral:  Negative for behavioral problems and dysphoric mood.  Objective:  BP 116/72   Pulse 75   Temp 98.3 F (36.8 C) (Oral)   Ht _0  (1.6 m)   Wt 144 lb 9.6 oz (65.6 kg)   LMP 01/31/2014   SpO2 95%   BMI 25.61 kg/m      12/26/2021    8:56 AM 11/24/2018    2:15 PM 11/16/2018    2:02 PM  BP/Weight  Systolic BP 161 096 045  Diastolic BP 72 86 78  Wt. (Lbs) 144.6    BMI 25.61 kg/m2        Physical Exam Exam conducted with a chaperone present.  Constitutional:      General: She is not in acute distress.    Appearance: She is well-developed. She is not diaphoretic.  HENT:     Head: Normocephalic.     Right Ear: External ear normal.     Left Ear: External  ear normal.     Nose: Nose normal.  Eyes:     Conjunctiva/sclera: Conjunctivae normal.     Pupils: Pupils are equal, round, and reactive to light.  Neck:     Vascular: No JVD.  Cardiovascular:     Rate and Rhythm: Normal rate and regular rhythm.     Heart sounds: Normal heart sounds. No murmur heard.    No gallop.  Pulmonary:     Effort: Pulmonary effort is normal. No respiratory distress.     Breath sounds: Normal breath sounds. No wheezing or rales.  Chest:     Chest wall: No tenderness.  Breasts:    Right: Normal. No mass, nipple discharge or tenderness.     Left: Normal. No mass, nipple discharge or tenderness.  Abdominal:     General: Bowel sounds are normal. There is no distension.     Palpations: Abdomen is soft. There is no mass.     Tenderness: There is no abdominal tenderness.     Hernia: There is no hernia in the left inguinal area or right inguinal area.  Genitourinary:    General: Normal vulva.     Pubic Area: No rash.      Labia:        Right: No rash.        Left: No rash.      Vagina: Normal.     Cervix: Normal.     Uterus: Normal.      Adnexa: Right adnexa normal and left adnexa normal.       Right: No tenderness.         Left: No tenderness.    Musculoskeletal:        General: No tenderness. Normal range of motion.     Cervical back: Normal range of motion. No tenderness.  Lymphadenopathy:     Upper Body:     Right upper body: No supraclavicular or axillary adenopathy.     Left upper body: No supraclavicular or axillary adenopathy.  Skin:    General: Skin is warm and dry.  Neurological:     Mental Status: She is alert and oriented to person, place, and time.     Deep Tendon Reflexes: Reflexes are normal and symmetric.        Latest Ref Rng & Units 04/02/2018    9:24 AM 03/31/2017   10:06 AM 03/11/2016    9:05 AM  CMP  Glucose 65 - 99 mg/dL 96  101  90   BUN 7 - 25 mg/dL _1 Creatinine 0.50 -  1.05 mg/dL 0.75  0.85  0.82   Sodium 135 -  146 mmol/L 142  141  141   Potassium 3.5 - 5.3 mmol/L 4.8  4.0  4.1   Chloride 98 - 110 mmol/L 106  106  105   CO2 20 - 32 mmol/L _0 Calcium 8.6 - 10.4 mg/dL 9.6  9.5  9.5   Total Protein 6.1 - 8.1 g/dL 6.9  7.1  7.1   Total Bilirubin 0.2 - 1.2 mg/dL 0.7  0.8  0.9   Alkaline Phos 33 - 130 U/L   91   AST 10 - 35 U/L _1 ALT 6 - 29 U/L _2 Lipid Panel     Component Value Date/Time   CHOL 195 04/02/2018 0924   TRIG 73 04/02/2018 0924   HDL 68 04/02/2018 0924   CHOLHDL 2.9 04/02/2018 0924   VLDL 13 03/11/2016 0905   LDLCALC 111 (H) 04/02/2018 0924    CBC    Component Value Date/Time   WBC 4.9 04/02/2018 0924   RBC 4.67 04/02/2018 0924   HGB 13.3 04/02/2018 0924   HCT 39.4 04/02/2018 0924   PLT 253 04/02/2018 0924   MCV 84.4 04/02/2018 0924   MCH 28.5 04/02/2018 0924   MCHC 33.8 04/02/2018 0924   RDW 12.5 04/02/2018 0924   LYMPHSABS 2,346 03/11/2016 0905   MONOABS 357 03/11/2016 0905   EOSABS 102 03/11/2016 0905   BASOSABS 51 03/11/2016 0905    Lab Results  Component Value Date   HGBA1C 5.8 (H) 11/10/2013    Assessment & Plan:  1. Annual physical exam Counseled on 150 minutes of exercise per week, healthy eating (including decreased daily intake of saturated fats, cholesterol, added sugars, sodium),routine healthcare maintenance.  - CBC with Differential/Platelet; Future  2. Screening for cervical cancer - Cytology - PAP  3. Encounter for screening mammogram for malignant neoplasm of breast - MM DIGITAL SCREENING BILATERAL; Future  4. Encounter for lipid screening for cardiovascular disease - LP+Non-HDL Cholesterol; Future  5. Screening for diabetes mellitus - Hemoglobin A1c; Future  6. Screening for metabolic disorder - RXV40+GQQP; Future - T4, free; Future - TSH; Future - T3; Future - VITAMIN D 25 Hydroxy (Vit-D Deficiency, Fractures); Future    No orders of the defined types were placed in this  encounter.   Follow-up: Return in about 1 year (around 12/27/2022) for [place], MD, FAAFP. [facility] and Bromide Groom, [place]   12/26/2021, 9:16 AM

## 2021-12-26 NOTE — Patient Instructions (Addendum)

## 2021-12-26 NOTE — Progress Notes (Signed)
No concerns CPE

## 2021-12-27 ENCOUNTER — Ambulatory Visit: Payer: Self-pay | Attending: Family Medicine

## 2021-12-27 DIAGNOSIS — Z131 Encounter for screening for diabetes mellitus: Secondary | ICD-10-CM

## 2021-12-27 DIAGNOSIS — Z13220 Encounter for screening for lipoid disorders: Secondary | ICD-10-CM

## 2021-12-27 DIAGNOSIS — Z0000 Encounter for general adult medical examination without abnormal findings: Secondary | ICD-10-CM

## 2021-12-27 DIAGNOSIS — Z13228 Encounter for screening for other metabolic disorders: Secondary | ICD-10-CM

## 2021-12-28 LAB — T3: T3, Total: 127 ng/dL (ref 71–180)

## 2021-12-28 LAB — LP+NON-HDL CHOLESTEROL
Cholesterol, Total: 212 mg/dL — ABNORMAL HIGH (ref 100–199)
HDL: 78 mg/dL (ref >39)
LDL Chol Calc (NIH): 123 mg/dL — ABNORMAL HIGH (ref 0–99)
Total Non-HDL-Chol (LDL+VLDL): 134 mg/dL — ABNORMAL HIGH (ref 0–129)
Triglycerides: 64 mg/dL (ref 0–149)
VLDL Cholesterol Cal: 11 mg/dL (ref 5–40)

## 2021-12-28 LAB — CBC WITH DIFFERENTIAL/PLATELET
Basophils Absolute: 0.1 10*3/uL (ref 0.0–0.2)
Basos: 1 %
EOS (ABSOLUTE): 0.1 10*3/uL (ref 0.0–0.4)
Eos: 3 %
Hematocrit: 42.3 % (ref 34.0–46.6)
Hemoglobin: 14.2 g/dL (ref 11.1–15.9)
Immature Grans (Abs): 0 10*3/uL (ref 0.0–0.1)
Immature Granulocytes: 0 %
Lymphocytes Absolute: 2.3 10*3/uL (ref 0.7–3.1)
Lymphs: 45 %
MCH: 28.6 pg (ref 26.6–33.0)
MCHC: 33.6 g/dL (ref 31.5–35.7)
MCV: 85 fL (ref 79–97)
Monocytes Absolute: 0.3 10*3/uL (ref 0.1–0.9)
Monocytes: 7 %
Neutrophils Absolute: 2.2 10*3/uL (ref 1.4–7.0)
Neutrophils: 44 %
Platelets: 257 10*3/uL (ref 150–450)
RBC: 4.97 x10E6/uL (ref 3.77–5.28)
RDW: 13.1 % (ref 11.7–15.4)
WBC: 5 10*3/uL (ref 3.4–10.8)

## 2021-12-28 LAB — HEMOGLOBIN A1C
Est. average glucose Bld gHb Est-mCnc: 126 mg/dL
Hgb A1c MFr Bld: 6 % — ABNORMAL HIGH (ref 4.8–5.6)

## 2021-12-28 LAB — T4, FREE: Free T4: 1.23 ng/dL (ref 0.82–1.77)

## 2021-12-28 LAB — CMP14+EGFR
ALT: 20 IU/L (ref 0–32)
AST: 23 IU/L (ref 0–40)
Albumin/Globulin Ratio: 1.5 (ref 1.2–2.2)
Albumin: 4.4 g/dL (ref 3.8–4.9)
Alkaline Phosphatase: 105 IU/L (ref 44–121)
BUN/Creatinine Ratio: 21 (ref 9–23)
BUN: 17 mg/dL (ref 6–24)
Bilirubin Total: 0.7 mg/dL (ref 0.0–1.2)
CO2: 26 mmol/L (ref 20–29)
Calcium: 10.1 mg/dL (ref 8.7–10.2)
Chloride: 102 mmol/L (ref 96–106)
Creatinine, Ser: 0.82 mg/dL (ref 0.57–1.00)
Globulin, Total: 2.9 g/dL (ref 1.5–4.5)
Glucose: 100 mg/dL — ABNORMAL HIGH (ref 70–99)
Potassium: 4.9 mmol/L (ref 3.5–5.2)
Sodium: 142 mmol/L (ref 134–144)
Total Protein: 7.3 g/dL (ref 6.0–8.5)
eGFR: 83 mL/min/{1.73_m2} (ref >59)

## 2021-12-28 LAB — TSH: TSH: 2.17 u[IU]/mL (ref 0.450–4.500)

## 2021-12-28 LAB — VITAMIN D 25 HYDROXY (VIT D DEFICIENCY, FRACTURES): Vit D, 25-Hydroxy: 41.1 ng/mL (ref 30.0–100.0)

## 2021-12-30 LAB — CYTOLOGY - PAP
Comment: NEGATIVE
Diagnosis: NEGATIVE
High risk HPV: NEGATIVE

## 2022-01-01 ENCOUNTER — Other Ambulatory Visit: Payer: Self-pay | Admitting: Obstetrics and Gynecology

## 2022-01-01 DIAGNOSIS — Z1231 Encounter for screening mammogram for malignant neoplasm of breast: Secondary | ICD-10-CM

## 2022-01-07 ENCOUNTER — Ambulatory Visit: Payer: Self-pay | Attending: Family Medicine

## 2022-02-12 ENCOUNTER — Telehealth: Payer: Self-pay

## 2022-02-12 NOTE — Telephone Encounter (Signed)
Returned patient's telephone call using language line, interpreter [phone]. Left voice message with [facility] contact information.

## 2022-02-13 ENCOUNTER — Ambulatory Visit: Payer: Self-pay | Admitting: Hematology and Oncology

## 2022-02-13 ENCOUNTER — Ambulatory Visit
Admission: RE | Admit: 2022-02-13 | Discharge: 2022-02-13 | Disposition: A | Discharge status: Home / Self Care | Payer: No Typology Code available for payment source | Source: Ambulatory Visit | Attending: Obstetrics and Gynecology | Admitting: Obstetrics and Gynecology

## 2022-02-13 VITALS — BP 108/63 | Wt 142.2 lb

## 2022-02-13 DIAGNOSIS — Z1239 Encounter for other screening for malignant neoplasm of breast: Secondary | ICD-10-CM

## 2022-02-13 DIAGNOSIS — Z1231 Encounter for screening mammogram for malignant neoplasm of breast: Secondary | ICD-10-CM

## 2022-02-13 NOTE — Progress Notes (Signed)
Suzanne Harrington is a 57 y.o. female who presents to [facility] clinic today with no complaints.    Pap Smear: Pap not smear completed today. Last Pap smear was 2023 and was normal. Per patient has no history of an abnormal Pap smear. Last Pap smear result is available in Epic.   Physical exam: Breasts Breasts symmetrical. No skin abnormalities bilateral breasts. No nipple retraction bilateral breasts. No nipple discharge bilateral breasts. No lymphadenopathy. No lumps palpated bilateral breasts.     MM 3D SCREEN BREAST BILATERAL  Result Date: 11/15/2018 CLINICAL DATA:  Screening. EXAM: DIGITAL SCREENING BILATERAL MAMMOGRAM WITH TOMO AND CAD COMPARISON:  Previous exam(s). ACR Breast Density Category c: The breast tissue is heterogeneously dense, which may obscure small masses. FINDINGS: There are no findings suspicious for malignancy. Images were processed with CAD. IMPRESSION: No mammographic evidence of malignancy. A result letter of this screening mammogram will be mailed directly to the patient. RECOMMENDATION: Screening mammogram in one year. (Code:SM-B-01Y) BI-RADS CATEGORY  1: Negative. Electronically Signed   By: Suzanne Harrington M.D.   On: 11/15/2018 14:12   MM SCREENING BREAST TOMO BILATERAL  Result Date: 05/06/2017 CLINICAL DATA:  Screening. EXAM: DIGITAL SCREENING BILATERAL MAMMOGRAM WITH TOMO AND CAD COMPARISON:  Previous exam(s). ACR Breast Density Category c: The breast tissue is heterogeneously dense, which may obscure small masses. FINDINGS: There are no findings suspicious for malignancy. Images were processed with CAD. IMPRESSION: No mammographic evidence of malignancy. A result letter of this screening mammogram will be mailed directly to the patient. RECOMMENDATION: Screening mammogram in one year. (Code:SM-B-01Y) BI-RADS CATEGORY  1: Negative. Electronically Signed   By: Suzanne Harrington M.D.   On: 05/06/2017 16:19      Pelvic/Bimanual Pap is not indicated today    Smoking  History: Patient has never smoked and was not referred to quit line.    Patient Navigation: Patient education provided. Access to services provided for patient through [place] interpreter provided. No transportation provided   Colorectal Cancer Screening: Per patient has had colonoscopy completed on 2019  and was normal. No complaints today. Will follow up with [facility].   Breast and Cervical Cancer Risk Assessment: Patient does not have family history of breast cancer, known genetic mutations, or radiation treatment to the chest before age 3. Patient does not have history of cervical dysplasia, immunocompromised, or DES exposure in-utero.  Risk Scores as of 02/13/2022     Suzanne Harrington           5-year 0.84 %   Lifetime 5.23 %   This patient is [place] but has no documented birth country, so the [place] used data from [place] patients to calculate their risk score. Document a birth country in the Demographics activity for a more accurate score.         Last calculated by Suzanne Harrington, CMA on 02/13/2022 at 10:00 AM        A: [facility] exam without pap smear No complaints with benign exam.  P: Referred patient to the [facility] for a screening mammogram. Appointment scheduled 02/13/22.  Suzanne Ped, NP 02/13/2022 10:25 AM

## 2022-02-13 NOTE — Patient Instructions (Signed)
[place] about breast self awareness and gave educational materials to take home. Patient did not need a Pap smear today due to last Pap smear was in 2023 per patient.  Let her know [facility] will cover Pap smears every 5 years unless has a history of abnormal Pap smears. Referred patient to the [facility] for screening mammogram. Appointment scheduled for 02/13/22. Patient aware of appointment and will be there. Let patient know will follow up with her within the next couple weeks with results. Suzanne Harrington verbalized understanding.  Melodye Ped, NP 10:30 AM

## 2022-12-30 ENCOUNTER — Ambulatory Visit: Payer: Commercial Managed Care - PPO | Admitting: Family Medicine
